# Patient Record
Sex: Female | Born: 1975 | Race: White | Hispanic: No | Marital: Married | Smoking: Never smoker
Health system: Southern US, Community
[De-identification: ages and names within clinical notes are randomized; demographics above are authoritative.]

## PROBLEM LIST (undated history)

## (undated) DIAGNOSIS — E669 Obesity, unspecified: Secondary | ICD-10-CM

## (undated) DIAGNOSIS — F4010 Social phobia, unspecified: Secondary | ICD-10-CM

## (undated) DIAGNOSIS — E890 Postprocedural hypothyroidism: Secondary | ICD-10-CM

## (undated) DIAGNOSIS — J301 Allergic rhinitis due to pollen: Secondary | ICD-10-CM

## (undated) DIAGNOSIS — Z8679 Personal history of other diseases of the circulatory system: Secondary | ICD-10-CM

## (undated) DIAGNOSIS — F410 Panic disorder [episodic paroxysmal anxiety] without agoraphobia: Secondary | ICD-10-CM

## (undated) DIAGNOSIS — E282 Polycystic ovarian syndrome: Secondary | ICD-10-CM

## (undated) DIAGNOSIS — I351 Nonrheumatic aortic (valve) insufficiency: Secondary | ICD-10-CM

## (undated) HISTORY — DX: Panic disorder (episodic paroxysmal anxiety): F41.0

## (undated) HISTORY — DX: Postprocedural hypothyroidism: E89.0

## (undated) HISTORY — DX: Social phobia, unspecified: F40.10

## (undated) HISTORY — DX: Polycystic ovarian syndrome: E28.2

## (undated) HISTORY — PX: LEEP: SHX91

## (undated) HISTORY — DX: Allergic rhinitis due to pollen: J30.1

## (undated) HISTORY — DX: Nonrheumatic aortic (valve) insufficiency: I35.1

## (undated) HISTORY — DX: Personal history of other diseases of the circulatory system: Z86.79

---

## 1997-02-02 HISTORY — PX: BREAST ENHANCEMENT SURGERY: SHX7

## 1999-02-03 HISTORY — PX: FOOT SURGERY: SHX648

## 1999-05-06 ENCOUNTER — Other Ambulatory Visit: Admission: RE | Admit: 1999-05-06 | Discharge: 1999-05-06 | Payer: Self-pay | Admitting: Obstetrics and Gynecology

## 2002-09-08 ENCOUNTER — Other Ambulatory Visit: Admission: RE | Admit: 2002-09-08 | Discharge: 2002-09-08 | Payer: Self-pay | Admitting: Obstetrics and Gynecology

## 2003-05-11 ENCOUNTER — Ambulatory Visit (HOSPITAL_COMMUNITY): Admission: RE | Admit: 2003-05-11 | Discharge: 2003-05-11 | Payer: Self-pay | Admitting: Obstetrics and Gynecology

## 2003-05-11 ENCOUNTER — Encounter (INDEPENDENT_AMBULATORY_CARE_PROVIDER_SITE_OTHER): Payer: Self-pay | Admitting: Specialist

## 2003-05-24 ENCOUNTER — Other Ambulatory Visit: Admission: RE | Admit: 2003-05-24 | Discharge: 2003-05-24 | Payer: Self-pay | Admitting: Obstetrics and Gynecology

## 2003-10-19 ENCOUNTER — Other Ambulatory Visit: Admission: RE | Admit: 2003-10-19 | Discharge: 2003-10-19 | Payer: Self-pay | Admitting: Obstetrics and Gynecology

## 2004-05-07 ENCOUNTER — Other Ambulatory Visit: Admission: RE | Admit: 2004-05-07 | Discharge: 2004-05-07 | Payer: Self-pay | Admitting: Obstetrics and Gynecology

## 2005-02-26 ENCOUNTER — Other Ambulatory Visit: Admission: RE | Admit: 2005-02-26 | Discharge: 2005-02-26 | Payer: Self-pay | Admitting: Obstetrics and Gynecology

## 2006-08-31 ENCOUNTER — Encounter: Admission: RE | Admit: 2006-08-31 | Discharge: 2006-11-29 | Payer: Self-pay | Admitting: Obstetrics and Gynecology

## 2006-12-09 ENCOUNTER — Ambulatory Visit (HOSPITAL_COMMUNITY): Admission: RE | Admit: 2006-12-09 | Discharge: 2006-12-09 | Payer: Self-pay | Admitting: *Deleted

## 2006-12-18 ENCOUNTER — Inpatient Hospital Stay (HOSPITAL_COMMUNITY): Admission: AD | Admit: 2006-12-18 | Discharge: 2006-12-18 | Payer: Self-pay | Admitting: Obstetrics and Gynecology

## 2007-01-05 ENCOUNTER — Ambulatory Visit (HOSPITAL_COMMUNITY): Admission: RE | Admit: 2007-01-05 | Discharge: 2007-01-05 | Payer: Self-pay | Admitting: Obstetrics and Gynecology

## 2007-02-03 HISTORY — PX: THYROIDECTOMY, PARTIAL: SHX18

## 2007-02-17 ENCOUNTER — Inpatient Hospital Stay (HOSPITAL_COMMUNITY): Admission: AD | Admit: 2007-02-17 | Discharge: 2007-02-17 | Payer: Self-pay | Admitting: Obstetrics and Gynecology

## 2007-03-01 ENCOUNTER — Inpatient Hospital Stay (HOSPITAL_COMMUNITY): Admission: AD | Admit: 2007-03-01 | Discharge: 2007-03-04 | Payer: Self-pay | Admitting: Obstetrics and Gynecology

## 2007-11-03 LAB — CONVERTED CEMR LAB: Pap Smear: ABNORMAL

## 2007-11-23 ENCOUNTER — Ambulatory Visit: Payer: Self-pay | Admitting: Family Medicine

## 2007-11-23 DIAGNOSIS — L905 Scar conditions and fibrosis of skin: Secondary | ICD-10-CM | POA: Insufficient documentation

## 2007-11-23 DIAGNOSIS — J45909 Unspecified asthma, uncomplicated: Secondary | ICD-10-CM | POA: Insufficient documentation

## 2007-11-23 DIAGNOSIS — J069 Acute upper respiratory infection, unspecified: Secondary | ICD-10-CM | POA: Insufficient documentation

## 2007-11-23 DIAGNOSIS — E039 Hypothyroidism, unspecified: Secondary | ICD-10-CM | POA: Insufficient documentation

## 2007-11-25 LAB — CONVERTED CEMR LAB: TSH: 3.03 microintl units/mL (ref 0.35–5.50)

## 2008-02-22 IMAGING — US US SOFT TISSUE HEAD/NECK
1 series · 14 of 25 positions shown · non-contrast
Comparison: None available.

CLINICAL DATA: Large thyroid nodule. 
 THYROID ULTRASOUND:
TECHNIQUE: Ultrasound examination of the thyroid gland and adjacent soft tissue structures was performed.

[Series 1: unknown · 0.09mm/px · 14 of 34 slices shown]
[im 1/34]
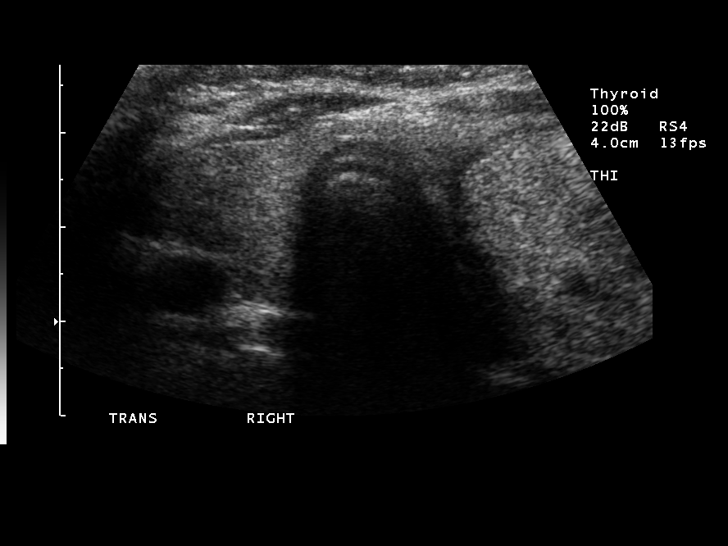
[im 3/34]
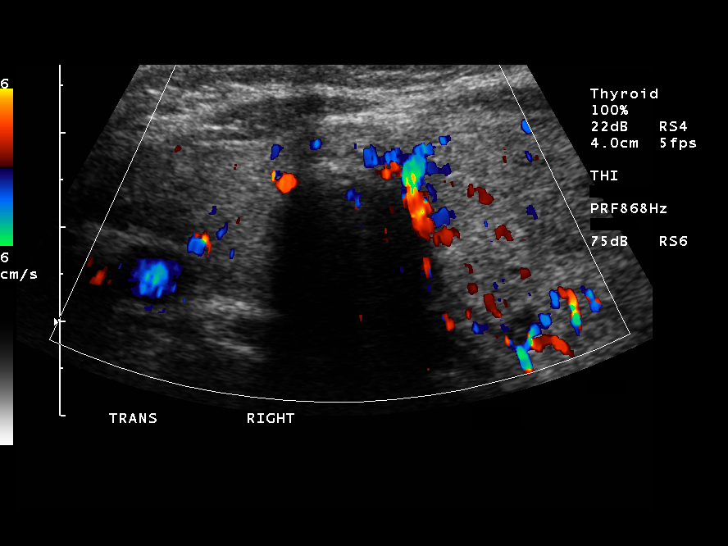
[im 6/34]
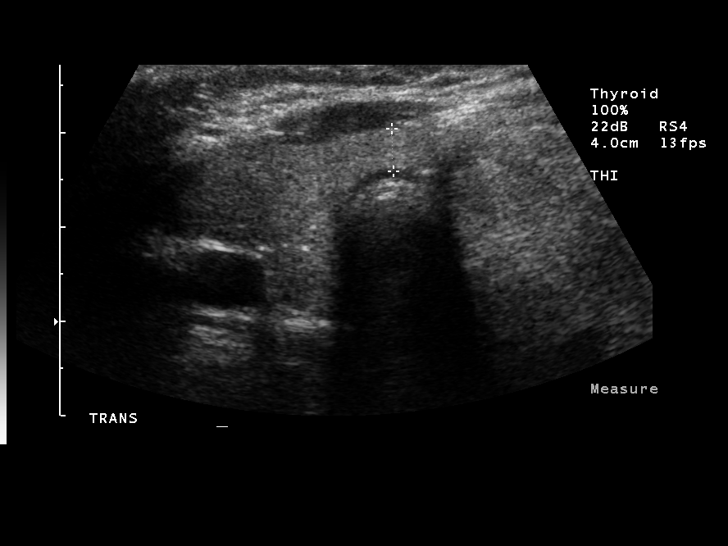
[im 9/34]
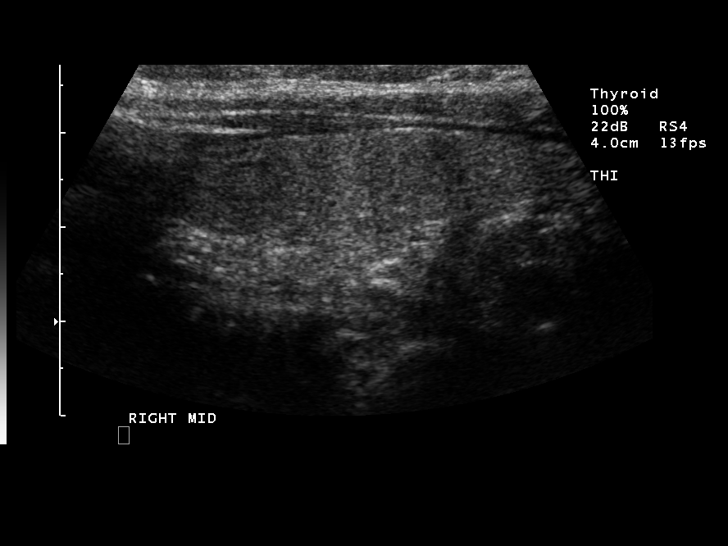
[im 12/34]
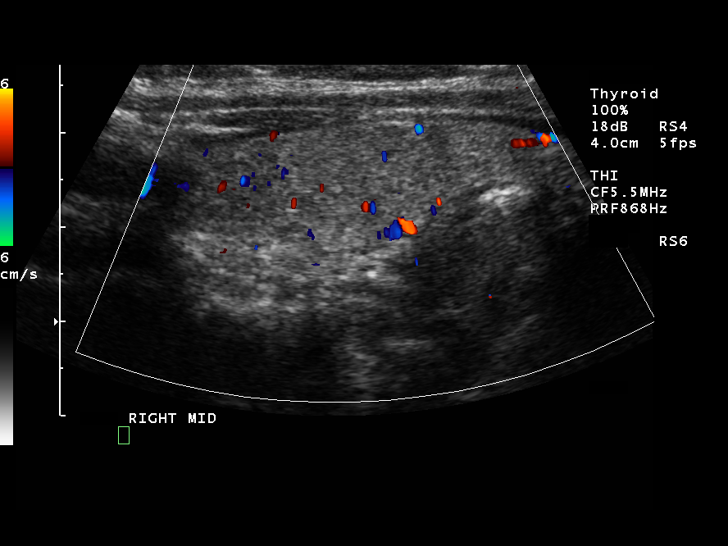
[im 13/34]
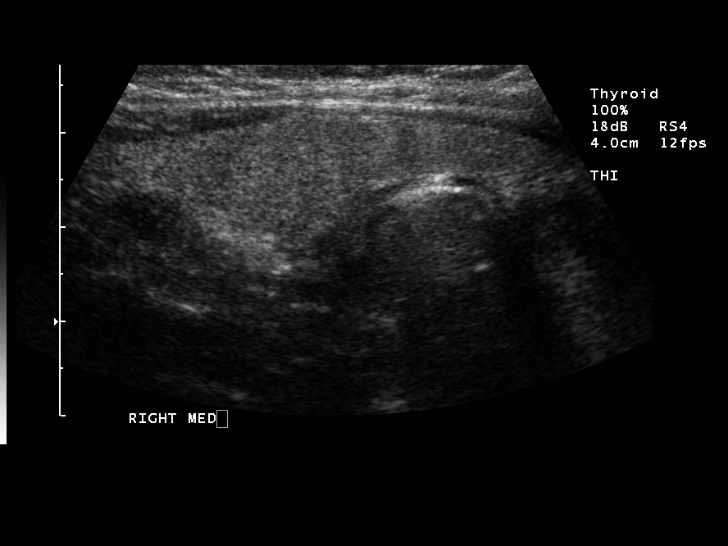
[im 16/34]
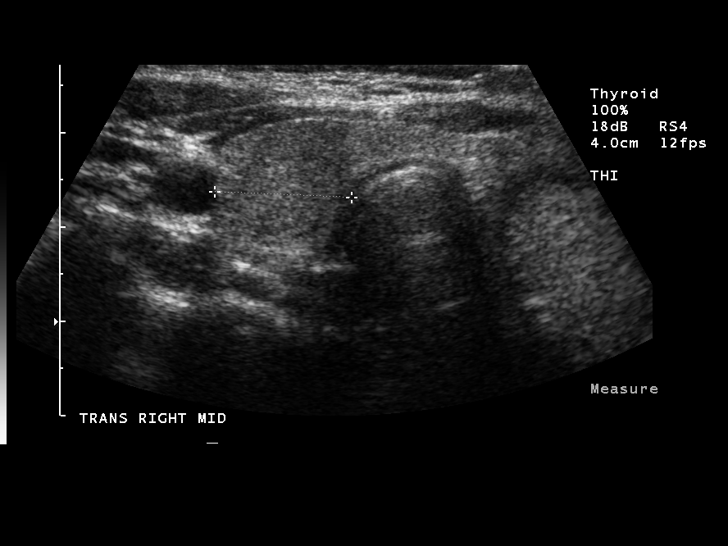
[im 18/34]
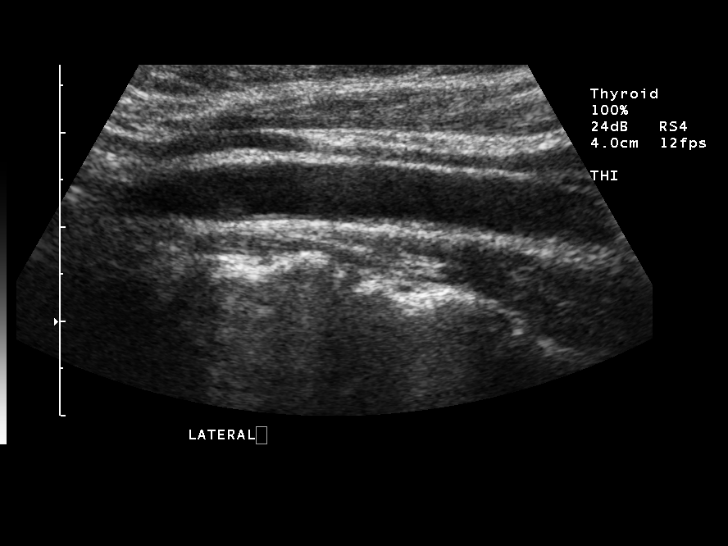
[im 21/34]
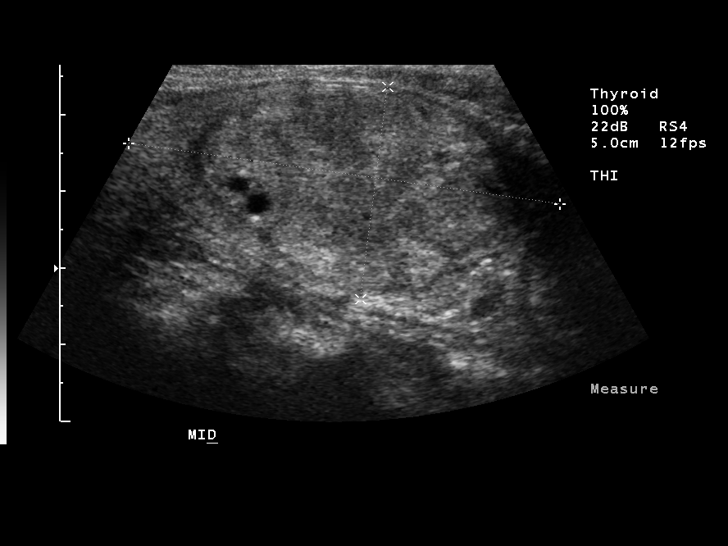
[im 23/34]
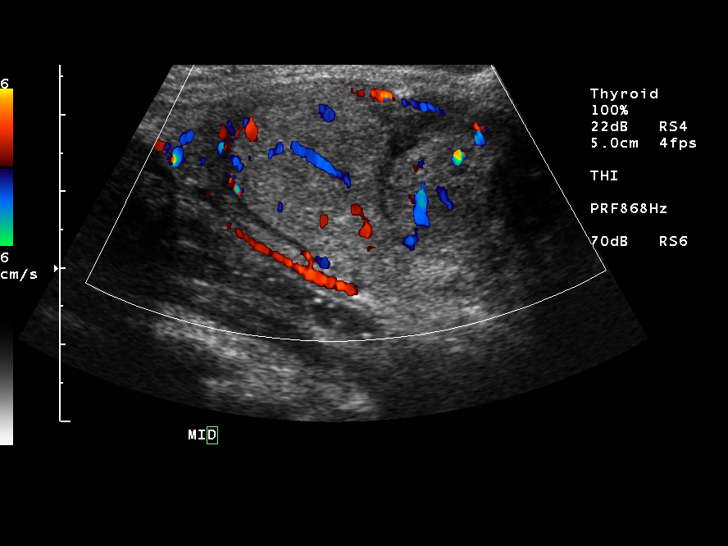
[im 25/34]
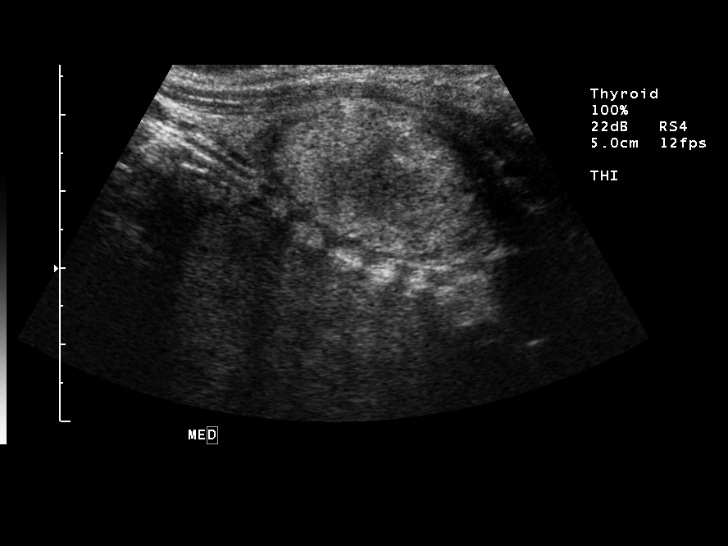
[im 28/34]
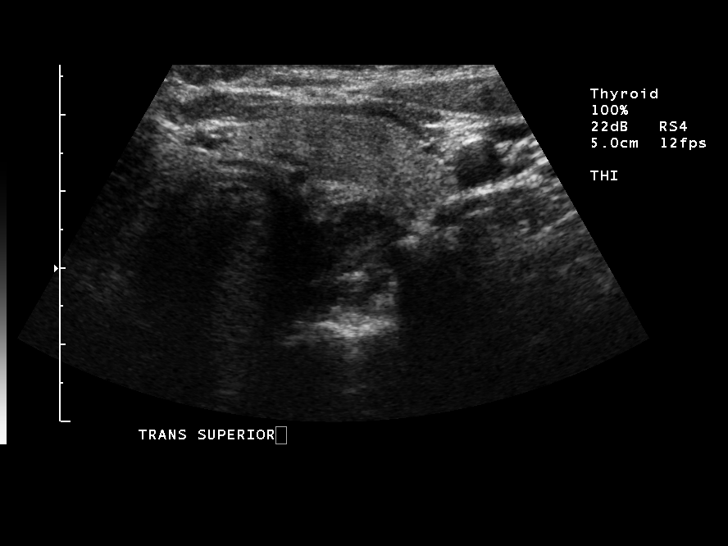
[im 31/34]
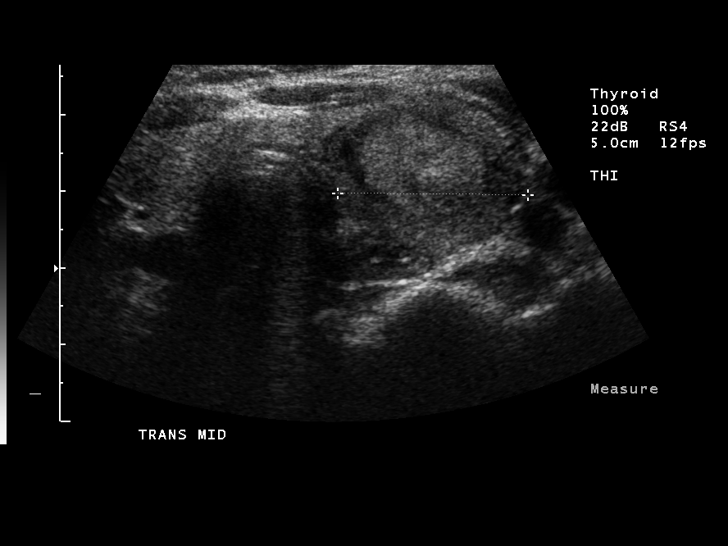
[im 34/34]
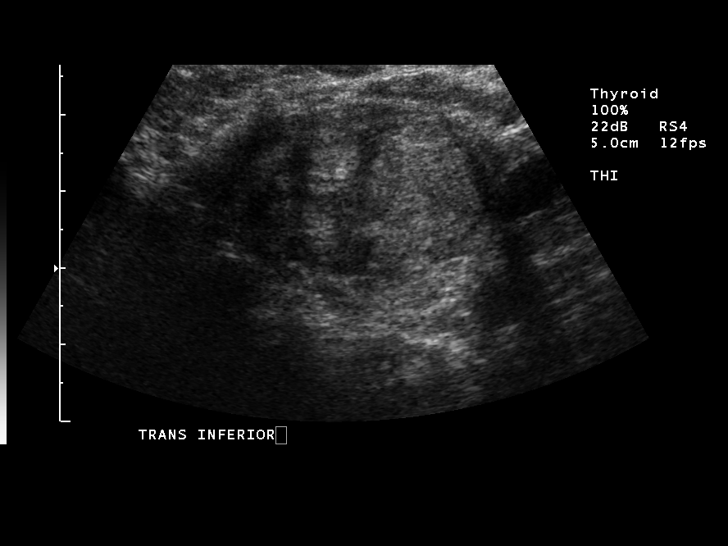

[14 of 25 positions shown; findings below may reference images not displayed]

FINDINGS: Right thyroid lobe measures 4.9 X 1.7 X 1.5 cm.  Left thyroid lobe measures 6.6 X 2.9 X 3.0 cm.  The isthmus is 4-5 mm.  
 No focal nodules on the right.  Within the left thyroid lobe, there is a large 5.1 X 4.3 X 2.6 cm solid heterogeneous mass.  Small cystic components noted as well.  Neoplasm cannot be excluded.
IMPRESSION: Single large nodule replacing much of the left thyroid lobe measuring up to 5.1 cm.  This is predominantly solid.  Recommend tissue sampling.

## 2009-03-13 ENCOUNTER — Telehealth: Payer: Self-pay | Admitting: Family Medicine

## 2009-09-12 ENCOUNTER — Ambulatory Visit: Payer: Self-pay | Admitting: Family Medicine

## 2009-09-12 DIAGNOSIS — R1011 Right upper quadrant pain: Secondary | ICD-10-CM | POA: Insufficient documentation

## 2009-09-16 ENCOUNTER — Encounter: Admission: RE | Admit: 2009-09-16 | Discharge: 2009-09-16 | Payer: Self-pay | Admitting: Family Medicine

## 2009-09-16 LAB — CONVERTED CEMR LAB
AST: 18 units/L (ref 0–37)
BUN: 19 mg/dL (ref 6–23)
CO2: 26 meq/L (ref 19–32)
Calcium: 9.5 mg/dL (ref 8.4–10.5)
Chloride: 106 meq/L (ref 96–112)
GFR calc non Af Amer: 123.75 mL/min (ref >60)
Lipase: 33 units/L (ref 11.0–59.0)
Potassium: 4.2 meq/L (ref 3.5–5.1)
Sodium: 142 meq/L (ref 135–145)
Total Protein: 7 g/dL (ref 6.0–8.3)

## 2009-10-25 ENCOUNTER — Ambulatory Visit: Payer: Self-pay | Admitting: Family Medicine

## 2009-10-25 DIAGNOSIS — M109 Gout, unspecified: Secondary | ICD-10-CM | POA: Insufficient documentation

## 2010-03-04 NOTE — Progress Notes (Signed)
Summary: pt going to urgent care  Phone Note Call from Patient Call back at Home Phone [phone]   Caller: Spouse Summary of Call: Pt's husband states pt has cough, chest hurts with coughing, sore throat- sxs x one week.  Advised husband that Dr. Ermalene Searing can work her in if she can come now.  Pt declined, said she would go to urgent care. Initial call taken by: Lowella Petties CMA,  March 13, 2009 10:33 AM

## 2010-03-04 NOTE — Assessment & Plan Note (Signed)
Summary: R FOOT PAIN/EVM   Vital Signs:  Patient Profile:   35 Years Old Female CC:      Right Foot Pain / rwt Height:     66.25 inches Weight:      215 pounds BMI:     34.56 O2 Sat:      99 % O2 treatment:    Room Air Temp:     97.2 degrees F oral Pulse rate:   102 / minute Pulse rhythm:   regular Resp:     18 per minute BP sitting:   146 / 95  (right arm)  Pt. in pain?   yes    Location:   foot    Intensity:   8    Type:       aching  Vitals Entered By: Levonne Spiller EMT-P (October 25, 2009 3:22 PM)              Is Patient Diabetic? No      Current Allergies: ! CODEINEHistory of Present Illness Chief Complaint: Right Foot Pain / rwt    Plan New Medications/Changes: COLCRYS 0.6 MG TABS (COLCHICINE) 2 by mouth then 1 by mouth 1 hour later for Gout. May take 1  Q8 hours as needed. Hold for loose stools/n/v  #30 x 0, 10/25/2009, Tacey Ruiz MD INDOMETHACIN 50 MG CAPS (INDOMETHACIN) 1 by mouth Q8 hours x 5 days max  #30 x 0, 10/25/2009, Tacey Ruiz MD   The patient and/or caregiver has been counseled thoroughly with regard to medications prescribed including dosage, schedule, interactions, rationale for use, and possible side effects and they verbalize understanding.  Diagnoses and expected course of recovery discussed and will return if not improved as expected or if the condition worsens. Patient and/or caregiver verbalized understanding.  Prescriptions: COLCRYS 0.6 MG TABS (COLCHICINE) 2 by mouth then 1 by mouth 1 hour later for Gout. May take 1  Q8 hours as needed. Hold for loose stools/n/v  #30 x 0   Entered and Authorized by:   Tacey Ruiz MD   Signed by:   Tacey Ruiz MD on 10/25/2009   Method used:   Electronically to        [facility] [phone][facility] (retail)       [address] [place], [place]  [address]       Ph: [phone] or [phone]       Fax: [phone]   RxID:   [id number] INDOMETHACIN 50 MG CAPS (INDOMETHACIN) 1 by  mouth Q8 hours x 5 days max  #30 x 0   Entered and Authorized by:   Tacey Ruiz MD   Signed by:   Tacey Ruiz MD on 10/25/2009   Method used:   Electronically to        [facility] [phone]* (retail)       [address] [place], [place]  [address]       Ph: [phone] or [phone]       Fax: [phone]   RxID:   [id number]   Patient Instructions: 1)  To prevent gout attacks,avoid purine rich foods, such as beer, beans & peas, and meat gravies.  Appended Document: R FOOT PAIN/EVM      Current Allergies: ! CODEINEHistory of Present Illness History from: patient Reason for visit: see chief complaint  Chief Complaint: R great toe pain History of Present Illness: Pain in right great toe began on Tues. Denies injury. She did wear uncomfortable shoes on Monday, but has never had this reaction previously.   She reports that she has been eating diet higher in proteins - in an attempt to loose weight. She has been quite successful and now has a regular menses as well.  She does not drink a lot of water.   Her father has gout.   REVIEW OF SYSTEMS Constitutional Symptoms      Denies fever and chills.       Musculoskeletal       Complains of joint pain, joint stiffness, decreased range of motion, redness, and swelling.   PMH-FH-SH reviewed for relevance  Family History: Reviewed history from 11/23/2007 and no changes required. Father:  Mother:  Siblings:   Parent: OA, BRCA, Mental Illness Grandparent: OA, Chol, CVA, HTN, Mental Illness GYN CA, CAD - other  Family History of Arthritis Family History Breast cancer 1st degree relative <50 Family History High cholesterol  Social History: Reviewed history from 11/23/2007 and no changes required. 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  Diagnoses and expected course of recovery discussed and will return if not improved as expected or if the condition worsens. Patient and/or caregiver verbalized understanding.   Medication Administration  Injection # 1:    Medication: Solumedrol up to 125mg     Diagnosis: Gout    Route: IM    Site: RUOQ gluteus    Exp Date: 03/04/2012    Lot #: Velva Harman    Mfr: Pfizer    Patient tolerated injection without complications    Given by: Levonne Spiller EMT-P (October 25, 2009 4:45 PM)  Orders Added: 1)  Est. Patient Level III [[address]] 2)  Solumedrol up to 125mg  [J2930] 3)  Admin of Therapeutic Inj (IM or [place]) [[address]]  The risks, benefits and possible side effects of the treatments and tests were explained clearly to the patient and the patient verbalized understanding.  The patient was informed that there is no on-call provider or services available at this clinic during off-hours (when the clinic is closed).  If the patient developed a problem or concern that required immediate attention, the patient was advised to go the the nearest available urgent care or emergency department for medical care.  The patient verbalized understanding.

## 2010-03-04 NOTE — Assessment & Plan Note (Signed)
Summary: ?GALLBLADDER/CLE   Vital Signs:  Patient profile:   35 year old female Height:      68 inches Weight:      204.4 pounds BMI:     31.19 Temp:     98.5 degrees F oral Pulse rate:   64 / minute Pulse rhythm:   regular BP sitting:   110 / 80  (left arm) Cuff size:   large  Vitals Entered By: Benny Lennert CMA Duncan Dull) (September 12, 2009 2:48 PM)  History of Present Illness: Chief complaint ? gallbladder  35 year old female:  Right rib, under.  Feels puff and swollen, and every tie she eats and will sick.  Also will get some shoulder  blade pain.  Gallbladder in multiple family member.  Sometimes will get some diarrhea   pleasant 35 year old lady, who is generally very healthy, and she presents with some right upper quadrant pain has been ongoing intermittently for the last 4 months or so. Primarily she has discomfort after she eats. She essentially has this after every meal. It is not an acute horrible pain, but is certainly very interfering.  Multiple families with gallbladder disease. In any history of liver or kidney problems. No known history of ulcer. Additionally, she had some recent shoulder blade pain in the right upper shoulder.  Review of systems: No fever, chills. There is nausea. She also has some occasional diarrhea. No bloody stools or black tarry stools. She is able to even drink, this is diminished due to the symptoms.GEN: WDWN, NAD, Non-toxic, A & O x 3 HEENT: Atraumatic, Normocephalic. Neck supple. No masses, No LAD. Ears and Nose: No external deformity. CV: RRR, No M/G/R. No JVD. No thrill. No extra heart sounds. PULM: CTA B, no wheezes, crackles, rhonchi. No retractions. No resp. distress. No accessory muscle use. ABD: S, NT, ND, +BS. No rebound tenderness. No HSM.  EXTR: No c/c/e NEURO: Normal gait.  PSYCH: Normally interactive. Conversant. Not depressed or anxious appearing.  Calm demeanor.    Allergies: 1)  ! Codeine  Past History:  Past  medical, surgical, family and social histories (including risk factors) reviewed, and no changes noted (except as noted below).  Past Medical History: Reviewed history from 11/23/2007 and no changes required. Asthma Hypothyroidism Migraines Allergic Rhinitis Heart Murmur h/o HBP w/o dx. HTN  Past Surgical History: Reviewed history from 11/23/2007 and no changes required. 2009, Partial Thyroidectomy, [facility] 2009, c-section [facility]) Foot Surgery, 2001 Breast Augmentation, 1999  Colpo, Cryo, Leeps  Family History: Reviewed history from 11/23/2007 and no changes required. Father:  Mother:  Siblings:   Parent: OA, BRCA, Mental Illness Grandparent: OA, Chol, CVA, HTN, Mental Illness GYN CA, CAD - other  Family History of Arthritis Family History Breast cancer 1st degree relative <50 Family History High cholesterol  Social History: Reviewed history from 11/23/2007 and no changes required. Marital Status: Married Children: 1 Occupation: Biomedical scientist Never Smoked Alcohol use-no Drug use-no Regular exercise-no   Impression & Recommendations:  Problem # 1:  ABDOMINAL PAIN, RIGHT UPPER QUADRANT (ICD-789.01) Assessment New 25 minutes spent face-to-face, greater than 50% in counseling. Upper quadrant plan, right: would undergo basic workup for abdominal pain, and obtain right upper quadrant ultrasound to evaluate for gallbladder pathology.  If the gallbladder does indeed have stones, the patient would not like to pursue further operative interventions at this point.  Orders: Venipuncture (27[address]) TLB-BMP (Basic Metabolic Panel-BMET) (80048-METABOL) TLB-Hepatic/Liver Function Pnl (80076-HEPATIC) TLB-Lipase (83690-LIPASE) TLB-H. Pylori Abs(Helicobacter Pylori) (86677-HELICO) Radiology Referral (  Radiology)  Complete Medication List: 1)  Tri-lo-sprintec 0.025 Mg Tabs (Norgestimate-ethinyl estradiol) .... Take one tablet daily  Patient Instructions: 1)  Referral  Appointment Information 2)  Day/Date: 3)  Time: 4)  Place/MD: 5)  Address: 6)  Phone/Fax: 7)  Patient given appointment information. Information/Orders faxed/mailed.   Current Allergies (reviewed today): ! CODEINE

## 2010-06-17 NOTE — Op Note (Signed)
Sharon Kerr, Sharon Kerr            ACCOUNT NO.:  [id number]   MEDICAL RECORD NO.:  [id number]          PATIENT TYPE:  INP   LOCATION:  9146                          [facility]ACILITY:  [facility]   PHYSICIAN:  Dineen Kid. Rana Snare, M.D.    DATE OF BIRTH:  [birth year]   DATE OF PROCEDURE:  03/01/2007  DATE OF DISCHARGE:                               OPERATIVE REPORT   PREOPERATIVE DIAGNOSIS:  Intrauterine pregnancy at 38 weeks, pregnancy-  induced hypertension and arrest of dilation.   POSTOPERATIVE DIAGNOSIS:  Intrauterine pregnancy at 38 weeks, pregnancy-  induced hypertension and arrest of dilation.   PROCEDURES:  Primary low segment transverse cesarean section.   SURGEON:  Dr. Candice Camp.   ANESTHESIA:  Epidural.   INDICATIONS:  [name] is a 35 year old G1 at 39-1/2 weeks'  gestational age who was seen in the office with elevated blood pressures  which had been persistent for the last 1-2 weeks.  She has occasional  headaches, no scotomata.  She was over the hospital for induction of  labor.  She was originally 2 cm and 80% with a +1 station after  amniotomy and augmentation of labor with Pitocin.  She progressed to 3-4  cm complete and +1 station.  Intrauterine catheter was placed and after  having adequate labor as evidenced by a greater than 200 mon[place]video  units for 5 hours no further progression beyond 3-4 cm complete +1  station, we proceeded with primary low segment transverse cesarean  section for arrest of dilation.  The risks, benefits were discussed.  Informed consent was obtained.   FINDINGS AT TIME OF SURGERY:  Viable female infant, Apgars were 09/09, pH  arterial 7.31, weight was 5 pounds 13 ounces.   DESCRIPTION OF PROCEDURE:  After adequate analgesia, the patient placed  in the supine position, left lateral tilt.  She is sterilely prepped and  draped.  Bladder sterilely drained.  Pfannenstiel skin incision made two  fingerbreadths above pubic symphysis, taken down sharply to  fascia which  was incised transversely, extended superiorly and inferiorly off the  bellies rectus muscle which was separated sharply in midline.  The  peritoneum was entered sharply.  Bladder flap created and placed behind  bladder blade.  Low segment myotomy incision made down to the infant's  vertex and lateral to the operator's fingertips.  Infant was delivered  atraumatically.  The nose and pharynx were suctioned.  Infant was  delivered, cord clamped and cut, handed to pediatrician for  resuscitation.  Cord blood was obtained.  Placenta extracted manually.  Uterus was exteriorized, wiped clean with a dry lap.  The myotomy  incision was closed in two layers, first being a running locking layer,  second being imbricating layer of 0-0 Monocryl suture.  The uterus  placed back the peritoneal cavity and after copious irrigation adequate  hemostasis was assured.  The peritoneum closed with 0-0 Monocryl.  Rectus muscle plicated in midline.  Irrigation applied and after  adequate hemostasis and the fascia was closed #1 PDS in a running  fashion.  Irrigation was applied and after  adequate hemostasis, skin staples, Steri-Strips applied.  The patient  tolerated procedure well, stable on transfer to recovery room, sponge  and instrument count was normal x3.  Estimated blood loss 600 mL.  The  patient received 1 gram of cefotetan after delivery of placenta.      Dineen Kid Rana Snare, M.D.  Electronically Signed     DCL/MEDQ  D:  03/02/2007  T:  03/02/2007  Job:  [id number]

## 2010-06-20 NOTE — Op Note (Signed)
NAME:  Sharon Kerr, Sharon Kerr                        ACCOUNT NO.:  [id number]   MEDICAL RECORD NO.:  [id number]                   PATIENT TYPE:  AMB   LOCATION:  SDC                                  [facility]   PHYSICIAN:  Dineen Kid. Rana Snare, M.D.                 DATE OF BIRTH:  [birth year]   DATE OF PROCEDURE:  05/11/2003  DATE OF DISCHARGE:                                 OPERATIVE REPORT   PREOPERATIVE DIAGNOSES:  1. Abnormal uterine bleeding.  2. Endometrial mass.   POSTOPERATIVE DIAGNOSES:  1. Abnormal uterine bleeding.  2. Endometrial mass.  3. Endometrial polyp.   PROCEDURES:  Hysteroscopy, dilatation and curettage.   SURGEON:  Dineen Kid. Rana Snare, M.D.   ANESTHESIA:  Monitored anesthetic care and paracervical block.   INDICATIONS:  [name] is a 35 year old with persistent heavy and  irregular bleeding despite birth control pills, who underwent a  sonohysterogram showing an endometrial mass consistent with a polyp.  She  presents today for hysteroscopy, D&C for removal of this polyp.  Risks and  benefits were discussed, informed consent was obtained.   Findings at the time of surgery:  A posterior fundal endometrial polyp, a  normal-appearing cervix, ostia, and endometrium after the procedure.   DESCRIPTION OF PROCEDURE:  After adequate analgesia, the patient placed in  the dorsal lithotomy position.  She was sterilely prepped and draped, the  bladder was sterilely drained, the Graves speculum was placed.  A tenaculum  was placed on the anterior lip of the cervix.  A paracervical block was  placed with 1:100,000 epinephrine and 1% Xylocaine, 20 mL were used.  The  uterus was sounded to 8 cm, easily dilated to a #27 Pratt dilator.  The  hysteroscope was inserted and the above findings were noted.  Polyp forceps  were used to grasp and retrieve the endometrial polyp.  This was followed by  sharp curettage, retrieving the small fragments of the base of the polyp.  The  hysteroscope was then inserted, revealing a normal-appearing endometrium  and normal-appearing cervix.  The hysteroscope was then removed, the  tenaculum removed from the anterior lip of the cervix.  It was noted to be  hemostatic.  The speculum was then removed and the patient was transferred  to the recovery room in stable condition.  The sponge and instrument count  was normal x3.  The patient received 30 mg of Toradol IV.   DISPOSITION:  The patient is to be discharged home, to follow up in the  office in one or two weeks.  She was sent home with a routine instruction  sheet for a D&C, told to return for increased cramping, bleeding, or fever.  Dineen Kid Rana Snare, M.D.   DCL/MEDQ  D:  05/11/2003  T:  05/11/2003  Job:  [id number]

## 2010-06-20 NOTE — H&P (Signed)
NAME:  Sharon Kerr, Sharon Kerr                        ACCOUNT NO.:  [id number]   MEDICAL RECORD NO.:  [id number]                   PATIENT TYPE:  AMB   LOCATION:  SDC                                  [facility]   PHYSICIAN:  Dineen Kid. Rana Snare, M.D.                 DATE OF BIRTH:  Aug 22, 1975   DATE OF ADMISSION:  DATE OF DISCHARGE:                                HISTORY & PHYSICAL   HISTORY OF PRESENT ILLNESS:  Ms. Artley is a 35 year old, nulligravida with  abnormal uterine bleeding and heavy bleeding despite being on birth control  pills going on for the last 4-5 years, underwent a sonohysterogram which  shows 6.8 cm uterus in a polypoid mass in the posterior endometrium  measuring 19 mm. She presents for definitive surgical evaluation and  treatment of this.   PAST MEDICAL HISTORY:  History of headaches.   PAST SURGICAL HISTORY:  Breast implants and foot surgery.   MEDICATIONS:  She has no known drug allergies.   PHYSICAL EXAMINATION:  VITAL SIGNS:  Blood pressure is 124/88.  HEART:  Regular rate and rhythm.  LUNGS:  Clear to auscultation bilaterally.  ABDOMEN:  Nondistended, nontender.  PELVIC:  The uterus is anteverted, mobile and nontender.   IMPRESSION:  Abnormal uterine bleeding, sonohysterogram consistent with  endometrial mass.   PLAN:  Hysteroscopy D&C for evaluation and removal of this.  The risks and  benefits were discussed at length which include but not limited to risk of  infection, bleeding, damage to uterus, tubes, ovaries, bowel, bladder, risk  associated with anesthesia, possibility this may not alleviate the bleeding  or worsen the bleeding or recur. She does give her informed consent.                                               Dineen Kid Rana Snare, M.D.    DCL/MEDQ  D:  05/10/2003  T:  05/10/2003  Job:  [id number]

## 2010-06-20 NOTE — Discharge Summary (Signed)
NAMECAPTOLA, TESCHNER            ACCOUNT NO.:  [id number]   MEDICAL RECORD NO.:  [id number]          PATIENT TYPE:  INP   LOCATION:  9146                          [facility]   PHYSICIAN:  Guy Sandifer. Henderson Cloud, M.D. DATE OF BIRTH:  [birth year]   DATE OF ADMISSION:  03/01/2007  DATE OF DISCHARGE:  03/04/2007                               DISCHARGE SUMMARY   ADMISSION DIAGNOSES:  1. Intrauterine pregnancy at 39-1/2 weeks estimated gestational age.  2. Induction of labor secondary to pregnancy-induced hypertension.   DISCHARGE DIAGNOSES:  1. Status post low transverse cesarean section secondary to arrest of      dilatation.  2. Viable female infant.   PROCEDURE:  Primary low transverse cesarean section.   REASON FOR ADMISSION:  Please see written H&P.   HOSPITAL COURSE:  The patient is a 35 year old primigravida who was  admitted to [facility] at 39-1/2 weeks estimated  gestational age for induction of labor.  The patient had been seen in  the office,  where she was noted to have elevation in blood pressure  with an occasional headache, no scotoma.  The patient was now admitted  for induction of labor secondary to [facility].  On admission, vital signs were  stable.  Fetal heart tones were in the 140s with acceleration.  Contractions were noted to be somewhat irregular.  Cervix was dilated to  2 cm, 80% effaced, vertex at a -1 station.  Artificial rupture of  membranes was performed, which revealed clear fluid.  The patient had  been started on some Pitocin to augment her labor; however, she made no  further progress beyond 3 to 4 cm dilated.  The patient was now  transferred to the operating room, where epidural was dosed to an  adequate surgical level.  A low transverse incision was made with  delivery of a viable female infant weighing 5 pounds 13 ounces, Apgars of  9 at 1 minute and 9 at 5 minutes.  Arterial cord pH was 7.31.  The  patient tolerated the procedure well and  was taken to the recovery room  in stable condition.  On postoperative day #1, the patient was without  complaint.  She denied headache, blurred vision or right upper quadrant  pain.  Vital signs were stable.  Blood pressure was 120/80.  Abdomen was  soft with good return of bowel function.  Fundus was firm and nontender.  Abdominal dressing was noted to have a small amount of drainage noted on  the bandage.  Foley was  noted to have adequate drainage.  Laboratory  findings revealed hemoglobin of 12.5, platelet count 195,000, WBC count  of 13.2.  Blood type was known to be A negative.  On postoperative day  #2, the patient was without complaint.  Vital signs were stable.  Abdomen was soft, slightly distended with good return of bowel function.  Fundus was firm and nontender.  Abdominal dressing was noted to have a  small amount of drainage noted on the bandage.  Foley had been  discontinued; however, she had not voided at the time of rounding.  On  postoperative day #3, the patient was without complaint.  Vital signs  were stable.  She was afebrile.  Fundus firm and nontender.  Incision  was clean, dry and intact.  Staples were removed, and the patient was  later discharged home.   CONDITION ON DISCHARGE:  Good.   DIET:  Regular as tolerated.   ACTIVITY:  No heavy lifting, no driving x2 weeks.  No vaginal entry.   FOLLOW UP:  The patient to follow up in the office in 3 to 4 days for  blood pressure check and an incision check.  She is to call for  headache, blurred vision or right upper quadrant pain.  She is also to  call for heavy vaginal bleeding, redness or drainage from the incisional  site, temperature greater than 100.5.   DISCHARGE MEDICATIONS:  Tylox #30, 1 p.o. every 4 to 6 hours p.r.n.,  Motrin 600 mg every 6 hours, prenatal vitamins 1 p.o. daily, Colace  daily p.r.n.      Julio Sicks, N.P.      Guy Sandifer. Henderson Cloud, M.D.  Electronically Signed    CC/MEDQ  D:   03/18/2007  T:  03/19/2007  Job:  [id number]

## 2010-10-23 LAB — CBC
HCT: 35.6 — ABNORMAL LOW
HCT: 39.8
Hemoglobin: 13.8
MCHC: 34.6
MCHC: 35
MCV: 88.7
MCV: 88.9
Platelets: 257
RBC: 4.01
RBC: 4.47
RDW: 13.1
RDW: 13.6
WBC: 13.2 — ABNORMAL HIGH
WBC: 9.6

## 2010-10-23 LAB — RH IMMUNE GLOB WKUP(>/=20WKS)(NOT WOMEN'S HOSP): Fetal Screen: NEGATIVE

## 2010-10-23 LAB — COMPREHENSIVE METABOLIC PANEL
ALT: 14
Albumin: 2.8 — ABNORMAL LOW
Alkaline Phosphatase: 133 — ABNORMAL HIGH
BUN: 8
Calcium: 9.6
Chloride: 105
Creatinine, Ser: 0.59
Total Protein: 6.4

## 2010-10-23 LAB — LACTATE DEHYDROGENASE: LDH: 128

## 2010-11-03 IMAGING — US US ABDOMEN COMPLETE
1 series · 13 of 25 positions shown · non-contrast
Comparison: None

CLINICAL DATA: Right upper quadrant abdominal pain.

ABDOMEN ULTRASOUND
TECHNIQUE: Complete abdominal ultrasound examination was performed
including evaluation of the liver, gallbladder, bile ducts,
pancreas, kidneys, spleen, IVC, and abdominal aorta.

[Series 1: us abdomen complete · 0.24mm/px · 13 of 80 slices shown]
[im 1/80]
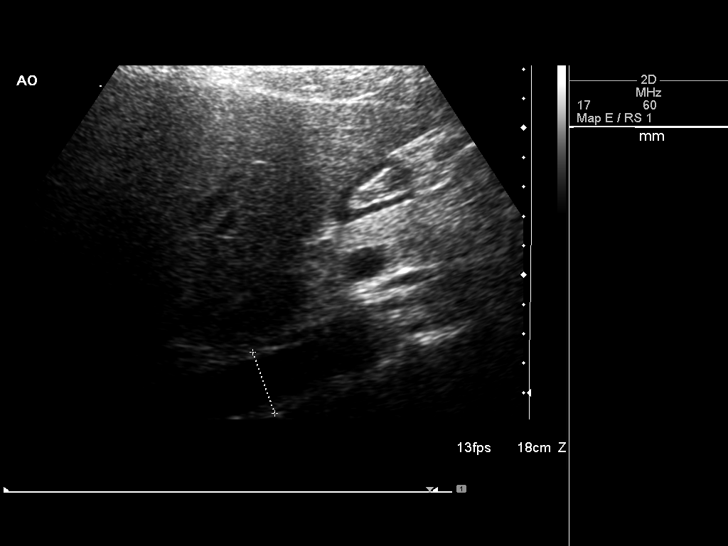
[im 7/80]
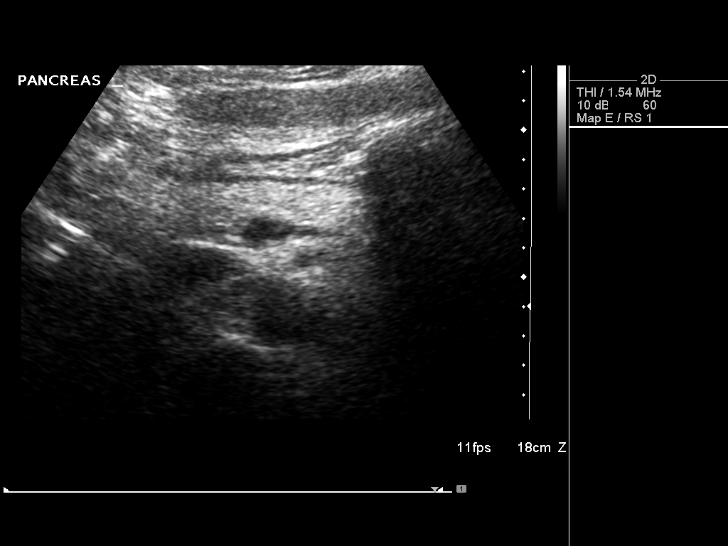
[im 14/80]
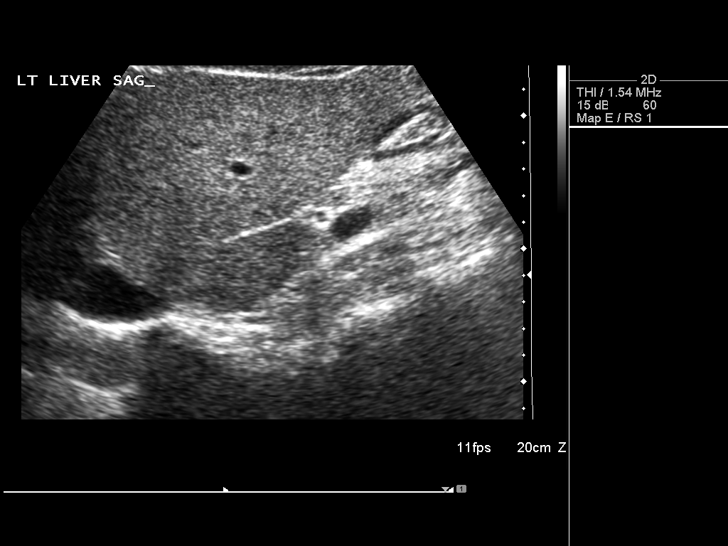
[im 20/80]
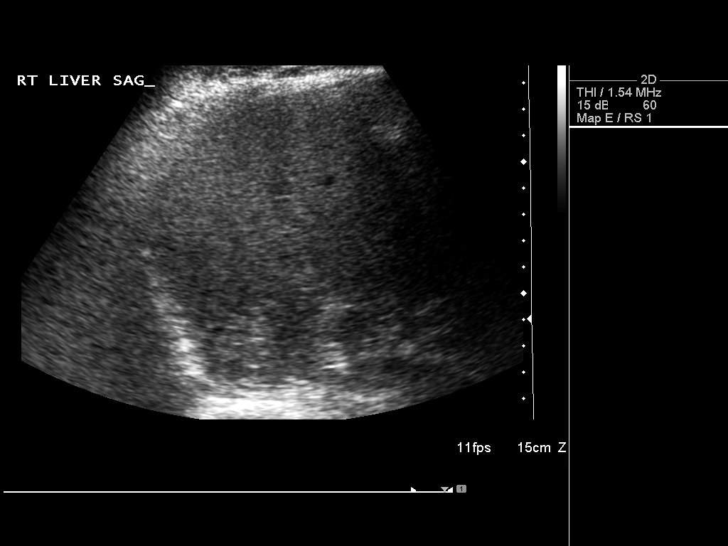
[im 27/80]
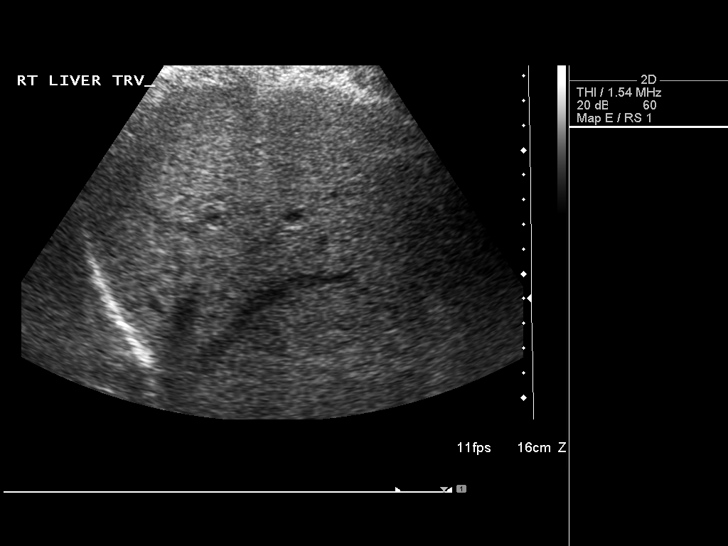
[im 33/80]
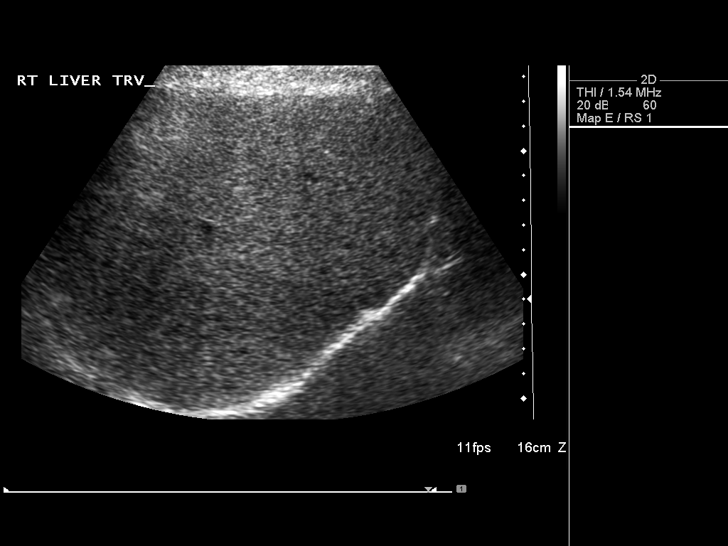
[im 40/80]
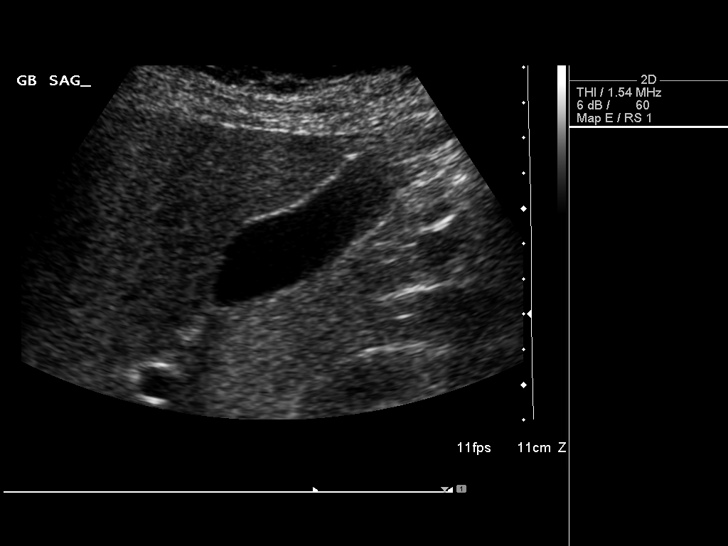
[im 47/80]
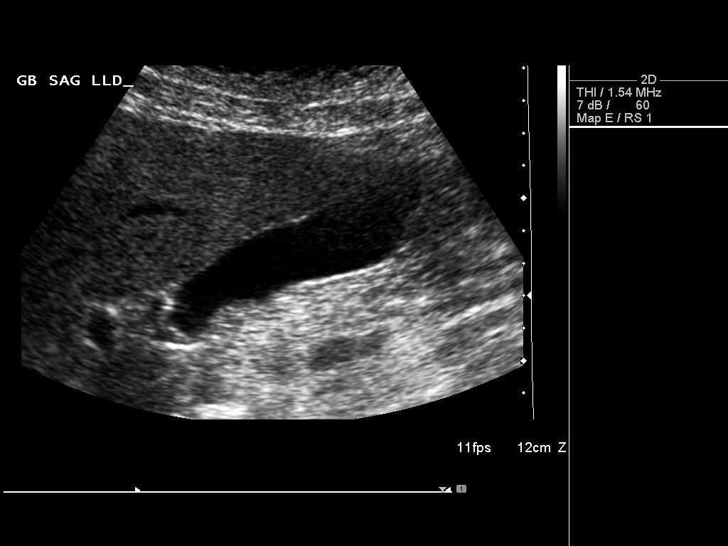
[im 53/80]
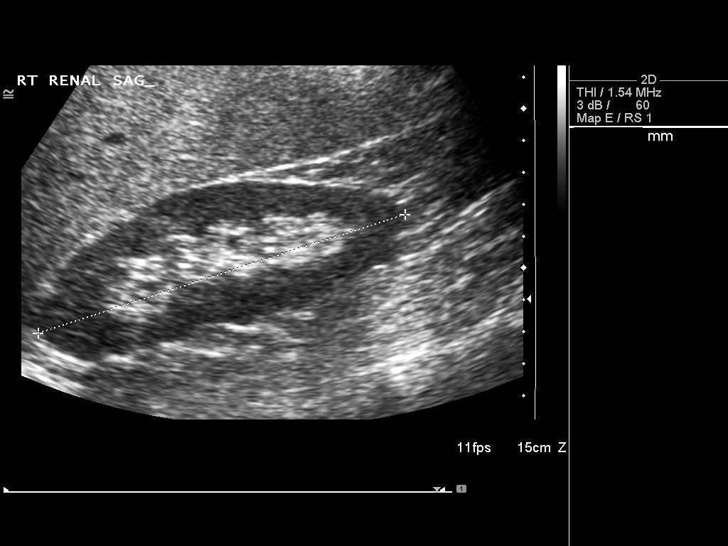
[im 60/80]
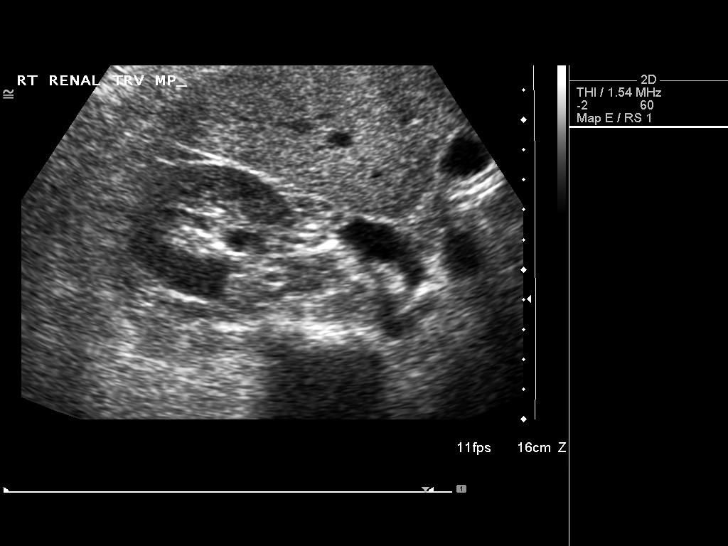
[im 66/80]
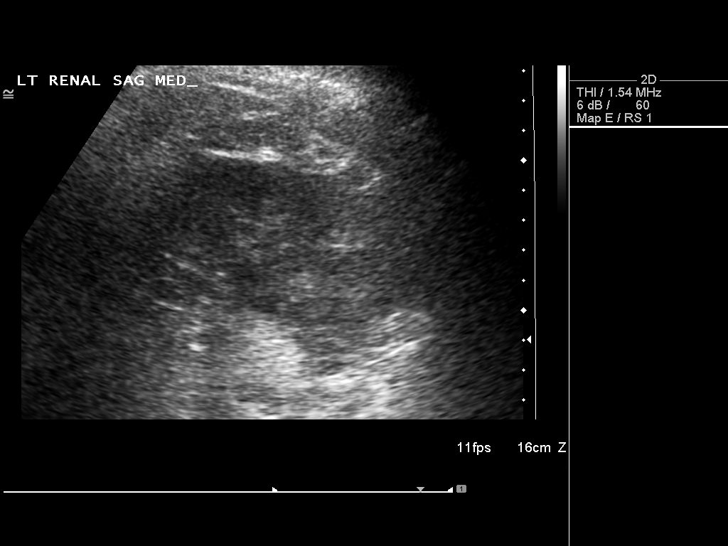
[im 73/80]
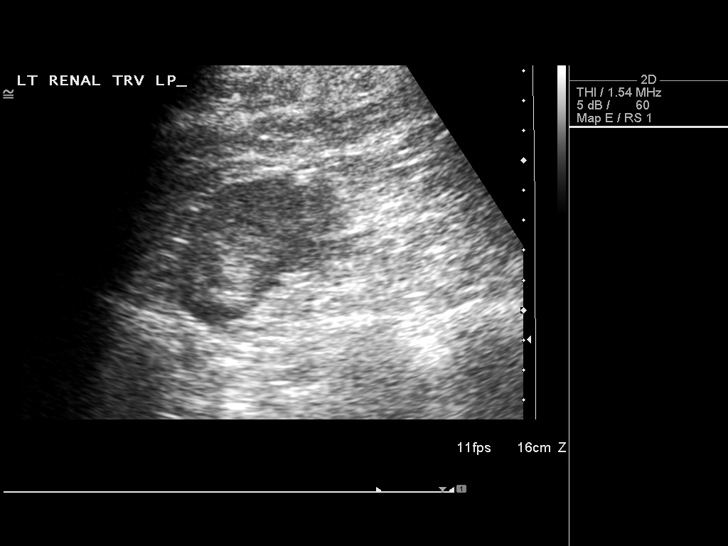
[im 80/80]
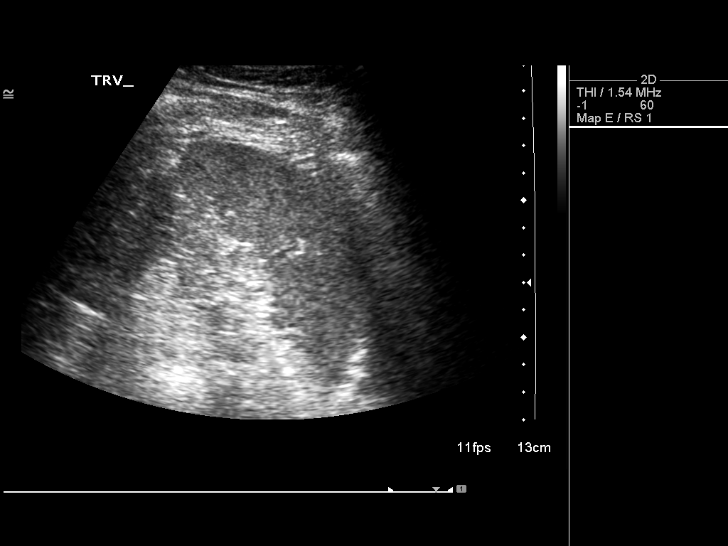

[13 of 25 positions shown; findings below may reference images not displayed]

FINDINGS: Gallbladder:  No shadowing gallstones or echogenic sludge.  No
gallbladder wall thickening or pericholecystic fluid.  Negative
sonographic Murphy's sign according to the ultrasound technologist.

Common Bile Duct:  Normal caliber of 3 mm

Liver:  The liver shows mild, diffuse increase of echotexture,
likely reflecting fatty infiltration.  No focal hepatic mass or
intrapelvic biliary ductal dilatation is identified.  The portal
vein shows normal patency.

IVC:  Patent throughout its visualized course in the abdomen.

Pancreas:  Normal visualized pancreas.  Pancreatic tail is
obscured.

Spleen:  The spleen shows normal echotexture and size.

Kidneys:  Right kidney measures 12.1 cm and the left kidney
cm.  Both have normal sonographic appearance.

Abdominal Aorta:  Normal in caliber throughout its visualized
course in the abdomen.
IMPRESSION: Increased echogenicity of the liver, likely reflecting fatty
infiltration.  Normal gallbladder and bile ducts.

## 2010-11-11 LAB — URINALYSIS, ROUTINE W REFLEX MICROSCOPIC
Bilirubin Urine: NEGATIVE
Glucose, UA: NEGATIVE
Hgb urine dipstick: NEGATIVE
Ketones, ur: 15 — AB
Nitrite: NEGATIVE
Protein, ur: NEGATIVE
Specific Gravity, Urine: >1.03 — ABNORMAL HIGH
Urobilinogen, UA: 0.2
pH: 6

## 2010-11-11 LAB — RH IMMUNE GLOBULIN WORKUP (NOT WOMEN'S HOSP)
ABO/RH(D): A NEG
Antibody Screen: NEGATIVE

## 2010-11-11 LAB — GLUCOSE TOLERANCE, 1 HOUR: Glucose, 1 Hour GTT: 119

## 2011-01-16 ENCOUNTER — Ambulatory Visit (INDEPENDENT_AMBULATORY_CARE_PROVIDER_SITE_OTHER): Payer: PRIVATE HEALTH INSURANCE | Admitting: Family Medicine

## 2011-01-16 ENCOUNTER — Encounter: Payer: Self-pay | Admitting: Family Medicine

## 2011-01-16 ENCOUNTER — Encounter: Payer: Self-pay | Admitting: *Deleted

## 2011-01-16 VITALS — BP 110/70 | HR 92 | Temp 98.8°F | Ht 67.0 in | Wt 215.0 lb

## 2011-01-16 DIAGNOSIS — J45901 Unspecified asthma with (acute) exacerbation: Secondary | ICD-10-CM

## 2011-01-16 DIAGNOSIS — J111 Influenza due to unidentified influenza virus with other respiratory manifestations: Secondary | ICD-10-CM

## 2011-01-16 MED ORDER — ALBUTEROL SULFATE HFA 108 (90 BASE) MCG/ACT IN AERS: 2.0000 | INHALATION_SPRAY | Freq: Four times a day (QID) | RESPIRATORY_TRACT | Status: DC | PRN

## 2011-01-16 NOTE — Progress Notes (Signed)
  Patient Name: Sharon Kerr Date of Birth: 1975-10-22 Age: 35 y.o. Medical Record Number: [id number] Gender: female  History of Present Illness:  Sharon Kerr is a 35 y.o. very pleasant female patient who presents with the following:  2 months --- felt like a cold and some congestion and some coughing came later. More like a dry annoying cough. Sometimes it would be OK. Sometimes coughing almost continuously. Vomitting three times a day. Today --- now with diarrhea. (Started some old Augmentin that they had at home) Muscle aches started this week.   No flu shot this year.   Has been coughing more over the last couple of months. Initially a couple of months ago with URI sx, then with prolonged intermittent cough. Distinctly different symptoms began in the last week -- diffuse arthralgias, myalgia, fever, and aching all over  Past Medical History, Surgical History, Social History, Family History, and Problem List have been reviewed in EHR and updated if relevant.  Review of Systems: ROS: GEN: Acute illness details above GI: Tolerating PO intake GU: maintaining adequate hydration and urination Pulm: No SOB Interactive and getting along well at home.  Otherwise, ROS is as per the HPI.   Physical Examination: Filed Vitals:   01/16/11 1112  BP: 110/70  Pulse: 92  Temp: 98.8 F (37.1 C)  TempSrc: Oral  Height: 5\' 7"  (1.702 m)  Weight: 215 lb (97.523 kg)  SpO2: 98%    Body mass index is 33.67 kg/(m^2).   Gen: WDWN, NAD; A & O x3, cooperative. Pleasant.Globally Non-toxic HEENT: Normocephalic and atraumatic. Throat clear, w/o exudate, R TM clear, L TM - good landmarks, No fluid present. rhinnorhea. No frontal or maxillary sinus T. MMM NECK: Anterior cervical  LAD is present CV: RRR, No M/G/R, cap refill <2 sec PULM: Breathing comfortably in no respiratory distress. no wheezing, crackles, rhonchi ABD: S,NT,ND,+BS. No HSM. No rebound. EXT: No c/c/e PSYCH: Friendly, good  eye contact MSK: Nml gait   Assessment and Plan: 1. Influenza with respiratory manifestation other than pneumonia   2. Asthma exacerbation, mild     The patient's clinical exam and history is consistent with a diagnosis of influenza.  I discussed with the patient that the CDC and [place] Infectious disease personel are recommended that we not do the rapid influenza screening test on people that have a clinical history and exam consistent with the diagnosis.  Supportive care. Fluids. Cough medicines as needed  Anti-pyretics. Detailed in p/i  Infection control emphasized, including OOW or school until AF 35 hours.   Some of the prolonged cough I suspect is cough from asthma, but cannot fully exclude pertussis. Will let influenza run its course and they will call next week if significant coughing persists.  Ventolin prn along with cough meds

## 2011-07-06 ENCOUNTER — Ambulatory Visit (INDEPENDENT_AMBULATORY_CARE_PROVIDER_SITE_OTHER): Payer: BC Managed Care – PPO | Admitting: Family Medicine

## 2011-07-06 ENCOUNTER — Encounter: Payer: Self-pay | Admitting: Family Medicine

## 2011-07-06 VITALS — BP 123/89 | HR 84 | Temp 98.7°F

## 2011-07-06 DIAGNOSIS — R5381 Other malaise: Secondary | ICD-10-CM

## 2011-07-06 DIAGNOSIS — R609 Edema, unspecified: Secondary | ICD-10-CM

## 2011-07-06 DIAGNOSIS — R42 Dizziness and giddiness: Secondary | ICD-10-CM

## 2011-07-06 DIAGNOSIS — Z3202 Encounter for pregnancy test, result negative: Secondary | ICD-10-CM

## 2011-07-06 DIAGNOSIS — I359 Nonrheumatic aortic valve disorder, unspecified: Secondary | ICD-10-CM

## 2011-07-06 DIAGNOSIS — R635 Abnormal weight gain: Secondary | ICD-10-CM

## 2011-07-06 DIAGNOSIS — I351 Nonrheumatic aortic (valve) insufficiency: Secondary | ICD-10-CM

## 2011-07-06 DIAGNOSIS — T148XXA Other injury of unspecified body region, initial encounter: Secondary | ICD-10-CM

## 2011-07-06 DIAGNOSIS — E039 Hypothyroidism, unspecified: Secondary | ICD-10-CM

## 2011-07-06 DIAGNOSIS — R002 Palpitations: Secondary | ICD-10-CM

## 2011-07-06 DIAGNOSIS — R0602 Shortness of breath: Secondary | ICD-10-CM

## 2011-07-06 HISTORY — DX: Nonrheumatic aortic (valve) insufficiency: I35.1

## 2011-07-06 LAB — POCT URINALYSIS DIPSTICK
Glucose, UA: NEGATIVE
Ketones, UA: NEGATIVE
Leukocytes, UA: NEGATIVE
Nitrite, UA: NEGATIVE
Protein, UA: NEGATIVE
Spec Grav, UA: 1.02
pH, UA: 6

## 2011-07-06 LAB — POCT URINE PREGNANCY: Preg Test, Ur: NEGATIVE

## 2011-07-06 NOTE — Patient Instructions (Signed)
REFERRAL: GO THE THE FRONT ROOM AT THE ENTRANCE OF OUR CLINIC, NEAR CHECK IN. ASK FOR Sharon Kerr. SHE WILL HELP YOU SET UP YOUR REFERRAL. DATE: TIME:  

## 2011-07-06 NOTE — Progress Notes (Signed)
[facility] at [facility]  Patient Name: Sharon Kerr Date of Birth: [birth year] Age: 36 y.o. Medical Record Number: [id number] Gender: female Date of Encounter: 07/06/2011  History of Present Illness:  Sharon Kerr is a 36 y.o. very pleasant female patient who presents with the following:  The patient presents with a multitude of complaints today. She is having some diffuse swelling and edema, and has had a 50 pound weight gain over one year unintentionally. She is symptomatically now on thyroid medication, prescribed by her obstetrician. She has not had her thyroid level checked in one year.  The swelling is painful and causing her some tightness in her skin. She is also having some significant shortness of breath with minimal exertion. She has a known history of aortic regurgitation that was diagnosed when she was approximately 36 years old. She has not seen a cardiologist in approximately 10 years.  She also is having some palpitations and fluttering sensation in her chest, and will become lightheaded and like she's been to pass out. This is not with exertion and can occur at any time. It will last for multiple seconds and is not a one or 2 beat phenomenon.  She denies any recreational drug use.  Went on thyroid medication, on synthroid now.   Woke up with swelling hands. Feet a great deal. When straightened out  Swelling is painful  No period, rarely sexually active.  HCG -- 0.6--0.7 Collene Gobble did go go on some cycles of HCG, this was approximately 2 years ago Synthroid - 1 year ago. 6-7 months ago.   Three weeks has had some palpitations 50 pound - weight gain within the year.   Dr. Rana Snare -  Took OTC diuretic  Endocrinologist at [facility] on Wednesday.   Short of breath walking in here  Past Medical History, Surgical History, Social History, Family History, Problem List, Medications, and Allergies have been reviewed and updated if relevant.  Prior to Admission  medications   Medication Sig Start Date End Date Taking? Authorizing Provider  levothyroxine (SYNTHROID, LEVOTHROID) 50 MCG tablet Take 50 mcg by mouth daily.   Yes Historical Provider, MD  albuterol (PROVENTIL HFA;VENTOLIN HFA) 108 (90 BASE) MCG/ACT inhaler Inhale 2 puffs into the lungs every 6 (six) hours as needed for wheezing. 01/16/11 01/16/12  Hannah Beat, MD  colchicine 0.6 MG tablet Take 0.6 mg by mouth daily.      Historical Provider, MD  dextromethorphan-guaiFENesin (MUCINEX DM) 30-600 MG per 12 hr tablet Take 1 tablet by mouth every 12 (twelve) hours.      Historical Provider, MD  Lorita Officer Triphasic (TRI-LO-SPRINTEC PO) Take 0.025 mg by mouth daily.      Historical Provider, MD    Review of Systems: As above. No chest pain. Shortness of breath as noted. Edema is noted. Otherwise as above. No nausea, vomiting, diarrhea. Absence of menses.  Physical Examination: Filed Vitals:   07/06/11 1547  BP: 123/89  Pulse: 84  Temp: 98.7 F (37.1 C)   Filed Vitals:   There is no height or weight on file to calculate BMI.   GEN: WDWN, NAD, Non-toxic, A & O x 3 HEENT: Atraumatic, Normocephalic. Neck supple. No masses, No LAD. Ears and Nose: No external deformity. CV: RRR, No M/G/R. No JVD. No thrill. No extra heart sounds. PULM: CTA B, no wheezes, crackles, rhonchi. No retractions. No resp. distress. No accessory muscle use. EXTR: No c/c/e NEURO Normal gait.  PSYCH: Normally interactive. Conversant. Not depressed or anxious appearing.  Calm demeanor.    Assessment and Plan:  1. Palpitation  EKG 12-Lead, EKG 12-Lead, Ambulatory referral to Cardiology  2. Edema  POCT urinalysis dipstick, CBC with Differential, Hepatic function panel, Ambulatory referral to Cardiology  3. Shortness of breath  Basic metabolic panel, Brain natriuretic peptide, Ambulatory referral to Cardiology  4. Aortic regurgitation  Ambulatory referral to Cardiology  5. Episodic lightheadedness    6.  Weight gain, abnormal    7. Bruising  CBC with Differential, Protime-INR  8. Other malaise and fatigue  Basic metabolic panel, Hepatic function panel  9. Hypothyroid  TSH  10. Negative pregnancy test  POCT urine pregnancy   Multiple problems.  EKG: Normal sinus rhythm. Normal axis, normal R wave progression, No acute ST elevation or depression.   50 pound weight gain with shortness of breath with minimal exertion and a 52 her old. Obtain pro BNP, and will refer to cardiology. She is also having some palpitations, and a presyncopal sort of event during these palpitations.  Hypothyroidism, check TSH. Patient has follow up with endocrine next week.  Additionally, we'll check her kidney function liver function to evaluate for her edema and should these organs are working properly.  Orders Today: Orders Placed This Encounter  Procedures  . Basic metabolic panel  . CBC with Differential  . Hepatic function panel  . Protime-INR  . TSH  . Brain natriuretic peptide  . Ambulatory referral to Cardiology    Referral Priority:  Routine    Referral Type:  Consultation    Referral Reason:  Specialty Services Required    Requested Specialty:  Cardiology    Number of Visits Requested:  1  . POCT urinalysis dipstick  . POCT urine pregnancy  . EKG 12-Lead    Standing Status: Standing     Number of Occurrences: 1     Standing Expiration Date:     Order Specific Question:  Reason for Exam    Answer:  palpitation    Medications Today: Meds ordered this encounter  Medications  . levothyroxine (SYNTHROID, LEVOTHROID) 50 MCG tablet    Sig: Take 50 mcg by mouth daily.     Results for orders placed in visit on 07/06/11  POCT URINALYSIS DIPSTICK      Component Value Range   Color, UA yellow     Clarity, UA clear     Glucose, UA negative     Bilirubin, UA negative     Ketones, UA negative     Spec Grav, UA 1.020     Blood, UA negative     pH, UA 6.0     Protein, UA negative      Urobilinogen, UA negative     Nitrite, UA negative     Leukocytes, UA Negative    POCT URINE PREGNANCY      Component Value Range   Preg Test, Ur Negative       Hannah Beat, MD 07/06/2011 4:11 PM

## 2011-07-07 LAB — CBC WITH DIFFERENTIAL/PLATELET
Basophils Relative: 0.6 % (ref 0.0–3.0)
Eosinophils Absolute: 0.5 10*3/uL (ref 0.0–0.7)
Monocytes Absolute: 0.6 10*3/uL (ref 0.1–1.0)
Monocytes Relative: 6.5 % (ref 3.0–12.0)
Neutro Abs: 5.2 10*3/uL (ref 1.4–7.7)
RDW: 13.5 % (ref 11.5–14.6)

## 2011-07-07 LAB — BASIC METABOLIC PANEL
BUN: 20 mg/dL (ref 6–23)
CO2: 23 mEq/L (ref 19–32)
Chloride: 108 mEq/L (ref 96–112)
Creatinine, Ser: 0.7 mg/dL (ref 0.4–1.2)
GFR: 98.91 mL/min (ref >60.00)
Glucose, Bld: 80 mg/dL (ref 70–99)
Potassium: 4 mEq/L (ref 3.5–5.1)
Sodium: 141 mEq/L (ref 135–145)

## 2011-07-07 LAB — PROTIME-INR
INR: 1 ratio (ref 0.8–1.0)
Prothrombin Time: 11 s (ref 10.2–12.4)

## 2011-07-07 LAB — HEPATIC FUNCTION PANEL
ALT: 130 U/L — ABNORMAL HIGH (ref 0–35)
Albumin: 3.9 g/dL (ref 3.5–5.2)

## 2011-07-08 ENCOUNTER — Encounter: Payer: Self-pay | Admitting: *Deleted

## 2011-07-08 ENCOUNTER — Other Ambulatory Visit (INDEPENDENT_AMBULATORY_CARE_PROVIDER_SITE_OTHER): Payer: BC Managed Care – PPO

## 2011-07-08 DIAGNOSIS — E041 Nontoxic single thyroid nodule: Secondary | ICD-10-CM | POA: Insufficient documentation

## 2011-07-08 DIAGNOSIS — R748 Abnormal levels of other serum enzymes: Secondary | ICD-10-CM

## 2011-07-08 DIAGNOSIS — E669 Obesity, unspecified: Secondary | ICD-10-CM | POA: Insufficient documentation

## 2011-07-08 DIAGNOSIS — R7401 Elevation of levels of liver transaminase levels: Secondary | ICD-10-CM

## 2011-07-09 LAB — HEPATITIS PANEL, ACUTE
HCV Ab: NEGATIVE
Hep B C IgM: NEGATIVE
Hepatitis B Surface Ag: NEGATIVE

## 2011-07-10 ENCOUNTER — Telehealth: Payer: Self-pay | Admitting: *Deleted

## 2011-07-10 NOTE — Telephone Encounter (Signed)
Patient says she went to [facility] to the Endocrinologist and they are asking for a copy of her most recent lipid panel to be faxed to [phone].  Is that okay?

## 2011-07-13 NOTE — Telephone Encounter (Signed)
Please, can you do this?

## 2011-07-14 NOTE — Telephone Encounter (Signed)
She came by and picked the others up already for her appt

## 2011-07-14 NOTE — Telephone Encounter (Signed)
We do not have a lipid panel on her. What do you want me to do?

## 2011-07-14 NOTE — Telephone Encounter (Signed)
Send all of most recent labs - there is no lipid

## 2011-07-24 ENCOUNTER — Ambulatory Visit: Payer: BC Managed Care – PPO | Admitting: Cardiovascular Disease

## 2011-08-21 ENCOUNTER — Ambulatory Visit: Payer: BC Managed Care – PPO | Admitting: Cardiovascular Disease

## 2012-04-13 ENCOUNTER — Other Ambulatory Visit: Payer: Self-pay | Admitting: Obstetrics and Gynecology

## 2012-10-12 ENCOUNTER — Ambulatory Visit: Payer: BC Managed Care – PPO | Admitting: Family Medicine

## 2012-12-15 ENCOUNTER — Ambulatory Visit (INDEPENDENT_AMBULATORY_CARE_PROVIDER_SITE_OTHER): Payer: BC Managed Care – PPO | Admitting: Family Medicine

## 2012-12-15 ENCOUNTER — Encounter: Payer: Self-pay | Admitting: Family Medicine

## 2012-12-15 VITALS — HR 103 | Temp 98.4°F | Ht 67.0 in

## 2012-12-15 DIAGNOSIS — F4010 Social phobia, unspecified: Secondary | ICD-10-CM

## 2012-12-15 DIAGNOSIS — M79674 Pain in right toe(s): Secondary | ICD-10-CM

## 2012-12-15 DIAGNOSIS — M79609 Pain in unspecified limb: Secondary | ICD-10-CM

## 2012-12-15 DIAGNOSIS — F410 Panic disorder [episodic paroxysmal anxiety] without agoraphobia: Secondary | ICD-10-CM

## 2012-12-15 DIAGNOSIS — J301 Allergic rhinitis due to pollen: Secondary | ICD-10-CM

## 2012-12-15 MED ORDER — ESCITALOPRAM OXALATE 5 MG PO TABS: 5.0000 mg | ORAL_TABLET | Freq: Every day | ORAL | Status: DC

## 2012-12-15 MED ORDER — PROPRANOLOL HCL 10 MG PO TABS: ORAL_TABLET | ORAL | Status: DC

## 2012-12-15 NOTE — Progress Notes (Signed)
Date:  12/15/2012   Name:  Sharon Kerr   DOB:  02/22/1975   MRN:  [id number] Gender: female Age: 37 y.o.  Primary Physician:  Hannah Beat, MD   Chief Complaint: Foot Pain   Subjective:   History of Present Illness:  Sharon Kerr is a 37 y.o. pleasant patient who presents with the following:  Elevated BP: 177/115 on machine 135/85 on my recheck with thigh cuff after 10 minutes  2 weeks ago, went to a party. For about 2 weeks, pain on the ball of the foot. Has been walking on lateral aspect of her foot.  R 2nd toe capsular tear. Pain and swelling mostly around plantar 2 and 3 heads  Hates going to crowds.  Social: she brought up the significant problem. She's been having long-standing panic attacks in difficulty with going to the social gatherings including going to church 2 or 3 times a week, some difficulties in the workplace, going to social of events where she has to meet and talk to people and shake hands. She denies depression. Her father also actually has some anxiety and panic attacks, and he was recently started on Lexapro.  Patient Active Problem List   Diagnosis Date Noted  . Social anxiety disorder 12/16/2012  . Panic attacks 12/16/2012  . Allergic rhinitis due to pollen   . Aortic regurgitation 07/06/2011  . ACUTE GOUTY ARTHROPATHY 10/25/2009  . HYPOTHYROIDISM 11/23/2007  . ASTHMA 11/23/2007    Past Medical History  Diagnosis Date  . Asthma   . Thyroid disease   . Migraine   . Allergy   . Heart murmur   . History of high blood pressure     without diag  . Aortic regurgitation 07/06/2011  . Panic attacks 12/16/2012  . Social anxiety disorder 12/16/2012    Past Surgical History  Procedure Laterality Date  . Thyroidectomy, partial  2009    [facility]  . Cesarean section    . Foot surgery  2001  . Breast enhancement surgery  1999  . [place]      History   Social History  . Marital Status: Married    Spouse Name: N/A    Number of Children: 1   . Years of Education: N/A   Occupational History  . Art Director    Social History Main Topics  . Smoking status: Never Smoker   . Smokeless tobacco: Never Used  . Alcohol Use: No  . Drug Use: No  . Sexual Activity: Not on file   Other Topics Concern  . Not on file   Social History Narrative  . No narrative on file    No family history on file.  Allergies  Allergen Reactions  . Codeine     Medication list has been reviewed and updated.  Review of Systems:   GEN: No acute illnesses, no fevers, chills. GI: No n/v/d, eating normally Pulm: No SOB Interactive and getting along well at home. Otherwise, ROS is as per the HPI.  Objective:   Physical Examination: Pulse 103  Temp(Src) 98.4 F (36.9 C) (Oral)  Ht 5\' 7"  (1.702 m)  LMP 11/28/2012  Ideal Body Weight: Weight in (lb) to have BMI = 25: 159.3   GEN: WDWN, NAD, Non-toxic, A & O x 3 HEENT: Atraumatic, Normocephalic. Neck supple. No masses, No LAD. Ears and Nose: No external deformity. CV: RRR, No M/G/R. No JVD. No thrill. No extra heart sounds. PULM: CTA B, no wheezes, crackles, rhonchi. No retractions. No resp. distress.  No accessory muscle use. EXTR: No c/c/e NEURO Normal gait.  PSYCH: Normally interactive. Conversant. Mildly anxious.  Calm demeanor.   RIGHT foot shows a stable ankle was negative anterior drawer and subtalar tilt testing. All bones of the mid foot and ankle are nontender. All ligaments are stable. There is some tenderness and swelling around the 2nd primarily as well as some of the 3rd MTP. There is pain with movement and dorsal or plantar direction and there is some pain more on the plantar aspect of the knee. Otherwise unremarkable. No significant fit tenderness. No perineal or posterior tib tenderness, Achilles, or calcaneal tenderness.  Laboratory and Imaging Data:  Assessment & Plan:    Toe pain, right  Social anxiety disorder  Panic attacks  Allergic rhinitis due to  pollen  I am not overly concerned regarding her toe pain, which is likely a capsular injury while at her party. I suggested a stiff shoe, and she may have appropriate home. If not, she will call, and we can easily give her a postoperative shoe.  She sounds like she has had some long-standing intermittent panic attacks, most significant around social times, consistent with social anxiety disorder with some intermittent panic attacks. Discussed all this in detail with her. She does not appear to be a depressed on top of this. I recommended that we start 5 mg of Lexapro and titrate this up. Also gave her some Inderal that she can use prior to speaking engagements or social settings. Follow up with me in 6 weeks.  There are no Patient Instructions on file for this visit.  Orders Today:  No orders of the defined types were placed in this encounter.    New medications, updates to list, dose adjustments: Meds ordered this encounter  Medications  . escitalopram (LEXAPRO) 5 MG tablet    Sig: Take 1 tablet (5 mg total) by mouth daily. Increase to 2 tabs after 2 weeks    Dispense:  60 tablet    Refill:  2  . propranolol (INDERAL) 10 MG tablet    Sig: 1 tab po prn as directed    Dispense:  30 tablet    Refill:  3    Signed,  Shandee Jergens T. Teasha Murrillo, MD, CAQ Sports Medicine  [facility] [address] [place] [address] Phone: [phone] Fax: [phone]  Updated Complete Medication List:   Medication List       This list is accurate as of: 12/15/12 11:59 PM.  Always use your most recent med list.               escitalopram 5 MG tablet  Commonly known as:  LEXAPRO  Take 1 tablet (5 mg total) by mouth daily. Increase to 2 tabs after 2 weeks     levothyroxine 50 MCG tablet  Commonly known as:  SYNTHROID, LEVOTHROID  Take 50 mcg by mouth daily.     propranolol 10 MG tablet  Commonly known as:  INDERAL  1 tab po prn as directed

## 2012-12-15 NOTE — Progress Notes (Signed)
Pre-visit discussion using our clinic review tool. No additional management support is needed unless otherwise documented below in the visit note.  

## 2012-12-16 ENCOUNTER — Encounter: Payer: Self-pay | Admitting: Family Medicine

## 2012-12-16 DIAGNOSIS — J301 Allergic rhinitis due to pollen: Secondary | ICD-10-CM | POA: Insufficient documentation

## 2012-12-16 DIAGNOSIS — F410 Panic disorder [episodic paroxysmal anxiety] without agoraphobia: Secondary | ICD-10-CM

## 2012-12-16 DIAGNOSIS — F4010 Social phobia, unspecified: Secondary | ICD-10-CM

## 2012-12-16 HISTORY — DX: Social phobia, unspecified: F40.10

## 2012-12-16 HISTORY — DX: Panic disorder (episodic paroxysmal anxiety): F41.0

## 2012-12-28 ENCOUNTER — Ambulatory Visit: Payer: Self-pay | Admitting: Podiatry

## 2013-01-18 ENCOUNTER — Encounter: Payer: Self-pay | Admitting: Family Medicine

## 2013-01-18 ENCOUNTER — Ambulatory Visit (INDEPENDENT_AMBULATORY_CARE_PROVIDER_SITE_OTHER): Payer: BC Managed Care – PPO | Admitting: Family Medicine

## 2013-01-18 VITALS — BP 120/84 | HR 96 | Temp 98.3°F

## 2013-01-18 DIAGNOSIS — R05 Cough: Secondary | ICD-10-CM

## 2013-01-18 DIAGNOSIS — R059 Cough, unspecified: Secondary | ICD-10-CM

## 2013-01-18 MED ORDER — ALBUTEROL SULFATE HFA 108 (90 BASE) MCG/ACT IN AERS: 2.0000 | INHALATION_SPRAY | Freq: Four times a day (QID) | RESPIRATORY_TRACT | Status: DC | PRN

## 2013-01-18 MED ORDER — DOXYCYCLINE HYCLATE 100 MG PO TABS: 100.0000 mg | ORAL_TABLET | Freq: Two times a day (BID) | ORAL | Status: DC

## 2013-01-18 NOTE — Patient Instructions (Signed)
Use the inhaler if needed.  If may make you feel jittery.  Start the antibiotics today.  Take care.  If not gradually improving then notify us.  Glad to see you.

## 2013-01-18 NOTE — Progress Notes (Signed)
Pre-visit discussion using our clinic review tool. No additional management support is needed unless otherwise documented below in the visit note.  H/o seasonal asthma "but I had a pretty good year."  She hasn't had to use SABA in a long time.    She has been sick for about 1 month.  Sneezing, congestion, ear pain, ST, then last week the cough started.  Sputum is dark green and thick.  Some wheeze.  No fevers now, but had some initially.  Cough comes in fits, keeps her up at night.  Taking nyquil for cough/sleep.  Fatigue, atypical for her.    We talked about albuterol potentially making panic sx worse.   Meds, vitals, and allergies reviewed.   ROS: See HPI.  Otherwise, noncontributory.  GEN: nad, alert and oriented HEENT: mucous membranes moist, tm w/o erythema, nasal exam w/o erythema, clear discharge noted,  OP with cobblestoning NECK: supple w/o LA CV: rrr.   PULM: ctab, no inc wob, no rhonchi. No wheeze EXT: no edema SKIN: no acute rash

## 2013-01-19 DIAGNOSIS — R059 Cough, unspecified: Secondary | ICD-10-CM | POA: Insufficient documentation

## 2013-01-19 DIAGNOSIS — R05 Cough: Secondary | ICD-10-CM | POA: Insufficient documentation

## 2013-01-19 NOTE — Assessment & Plan Note (Signed)
Not using BB yet.  D/w pt about SABA vs BB use.  SABA cautions given.  Use prn for wheeze.  Start doxy given the duration and the sputum.  Nontoxic. She agrees.  Fu prn.   See instructions.

## 2013-01-30 ENCOUNTER — Ambulatory Visit: Payer: BC Managed Care – PPO | Admitting: Family Medicine

## 2013-01-30 DIAGNOSIS — Z0289 Encounter for other administrative examinations: Secondary | ICD-10-CM

## 2013-04-07 ENCOUNTER — Other Ambulatory Visit: Payer: Self-pay | Admitting: Family Medicine

## 2013-07-14 ENCOUNTER — Other Ambulatory Visit: Payer: Self-pay | Admitting: Family Medicine

## 2013-07-14 NOTE — Telephone Encounter (Signed)
Last filled 04/07/13 #60 x 2

## 2013-10-25 ENCOUNTER — Other Ambulatory Visit: Payer: Self-pay | Admitting: Family Medicine

## 2013-10-25 NOTE — Telephone Encounter (Signed)
Last filled 07/14/13

## 2013-10-26 ENCOUNTER — Other Ambulatory Visit: Payer: Self-pay | Admitting: Family Medicine

## 2014-02-28 ENCOUNTER — Other Ambulatory Visit: Payer: Self-pay | Admitting: Family Medicine

## 2014-02-28 NOTE — Telephone Encounter (Signed)
Last office visit 01/18/2013 with Dr. Para Marchuncan for cough.  No future appointments scheduled.  Refill?

## 2014-04-11 ENCOUNTER — Other Ambulatory Visit: Payer: Self-pay | Admitting: Obstetrics and Gynecology

## 2014-04-12 LAB — CYTOLOGY - PAP

## 2014-04-24 ENCOUNTER — Other Ambulatory Visit: Payer: Self-pay | Admitting: Family Medicine

## 2014-04-24 NOTE — Telephone Encounter (Signed)
Please call and scheduled CPE with Dr. Patsy Lageropland sometime in the next three months.  She needs this appointment in order to keep getting refills on her medications.

## 2014-04-24 NOTE — Telephone Encounter (Signed)
Ok to refill 3 mo worth.  F/u for cpx next few months

## 2014-04-24 NOTE — Telephone Encounter (Signed)
Last office visit 01/18/2013 with Dr. Para Marchuncan.  No future appointments scheduled.  Refill?

## 2014-04-26 NOTE — Telephone Encounter (Signed)
Pt scheduled 5/16 for labs and 5/23 for cpe

## 2014-05-10 ENCOUNTER — Other Ambulatory Visit: Payer: Self-pay | Admitting: Obstetrics and Gynecology

## 2014-05-26 ENCOUNTER — Ambulatory Visit (INDEPENDENT_AMBULATORY_CARE_PROVIDER_SITE_OTHER): Payer: BLUE CROSS/BLUE SHIELD | Admitting: Family Medicine

## 2014-05-26 ENCOUNTER — Encounter: Payer: Self-pay | Admitting: Family Medicine

## 2014-05-26 ENCOUNTER — Ambulatory Visit (HOSPITAL_COMMUNITY)
Admission: RE | Admit: 2014-05-26 | Discharge: 2014-05-26 | Disposition: A | Discharge status: Home / Self Care | Payer: BLUE CROSS/BLUE SHIELD | Source: Ambulatory Visit | Attending: Family Medicine | Admitting: Family Medicine

## 2014-05-26 VITALS — BP 140/100 | HR 104 | Temp 98.0°F

## 2014-05-26 DIAGNOSIS — M79662 Pain in left lower leg: Secondary | ICD-10-CM

## 2014-05-26 DIAGNOSIS — M7989 Other specified soft tissue disorders: Secondary | ICD-10-CM | POA: Diagnosis not present

## 2014-05-26 MED ORDER — MELOXICAM 15 MG PO TABS: 15.0000 mg | ORAL_TABLET | Freq: Every day | ORAL | Status: DC

## 2014-05-26 NOTE — Assessment & Plan Note (Signed)
Patient is having left calf pain. I do believe that this is most likely a muscle tear but with patient's recent travel history do need to rule out a deep venous thrombosis. Patient does have some risk factors including hypothyroidism, hypertension, and obesity with sedentary lifestyle. Patient has been told the possible repercussions if this is a deep venous thrombosis and has elected to go with a Doppler. Called to [facility] has been placed and scheduled.  As long as Doppler is normal patient will be treated for more of the calf injury. Home exercises, icing protocol, as well as compression sleeve. Patient will make these changes and follow-up with myself or primary care provider in 2-3 weeks.

## 2014-05-26 NOTE — Progress Notes (Signed)
Sharon Kerr [address] [place], [place] [address] Phone: [phone] Subjective:     CC: Injury to calf left  [id number] Sharon Kerr is a 39 y.o. female coming in with complaint of calf pain. Patient was traveling on an airplane 3 days ago and started feeling some tightness in her left calf. Patient did have a severe pain and has been difficulty ambulating for the last couple days. Patient notices that there is some swelling as well. Patient was unable to do stairs secondary to the pain. Patient states that this side seems to be more swollen than the other side. Denies any fevers or chills or any abnormal weight loss. Patient also denies any shortness of breath or cough recently. Patient though does not feel like it is quite getting better and is scared that she tore her muscle. Patient's plane trip was 3 hours. Scrubs the pain as a throbbing sensation that can be sharp with certain movements.  Past Medical History  Diagnosis Date  . Asthma   . Thyroid disease   . Migraine   . Allergic rhinitis due to pollen   . Heart murmur     aortic regurg  . History of high blood pressure     without diag  . Aortic regurgitation 07/06/2011  . Panic attacks 12/16/2012  . Social anxiety disorder 12/16/2012   Past Surgical History  Procedure Laterality Date  . Thyroidectomy, partial  2009    [facility]  . Cesarean section    . Foot surgery  2001  . Breast enhancement surgery  1999  . [facility]     History   Social History  . Marital Status: Married    Spouse Name: N/A  . Number of Children: 1  . Years of Education: N/A   Occupational History  . Art Director    Social History Main Topics  . Smoking status: Never Smoker   . Smokeless tobacco: Never Used  . Alcohol Use: No  . Drug Use: No  . Sexual Activity: Not on file   Other Topics Concern  . Not on file   Social History Narrative  . No narrative on file   No family history on file. Allergies    Allergen Reactions  . Codeine        Past medical history, social, surgical and family history all reviewed in electronic medical record.   Review of Systems: No headache, visual changes, nausea, vomiting, diarrhea, constipation, dizziness, abdominal pain, skin rash, fevers, chills, night sweats, weight loss, swollen lymph nodes, body aches, joint swelling, muscle aches, chest pain, shortness of breath, mood changes.   Objective Blood pressure 140/100, pulse 104, temperature 98 F (36.7 C), temperature source Oral, SpO2 94 %.  General: No apparent distress alert and oriented x3 mood and affect normal, dressed appropriately. Obese.  HEENT: Pupils equal, extraocular movements intact  Respiratory: Patient's speak in full sentences and does not appear short of breath  Cardiovascular: Trace lower extremity edema, tender on left, no erythema  Skin: Warm dry intact with no signs of infection or rash on extremities or on axial skeleton.  Abdomen: Soft nontender  Neuro: Cranial nerves II through XII are intact, neurovascularly intact in all extremities with 2+ DTRs and 2+ pulses.  Lymph: No lymphadenopathy of posterior or anterior cervical chain or axillae bilaterally.  Gait normal with good balance and coordination.  MSK:  Non tender with full range of motion and good stability and symmetric strength and tone of  shoulders, elbows, wrist, hip, knees bilaterally.  Ankle: left Moderate swelling of the left calf compared to contralateral side. Both have trace effusion.  Patient is tender to palpation over the proximal lateral gastrocnemius head. Patient was unfortunately also significantly tender in the popliteal fossa. Range of motion is full in all directions. Strength is 5/5 in all directions. Stable lateral and medial ligaments; squeeze test and kleiger test unremarkable; Talar dome nontender; No pain at base of 5th MT; No tenderness over cuboid; No tenderness over N spot or navicular  prominence No tenderness on posterior aspects of lateral and medial malleolus No sign of peroneal tendon subluxations or tenderness to palpation Negative tarsal tunnel tinel's Able to walk 4 steps. Negative Thompson with full strength with dorsiflexion.   Impression and Recommendations:     This case required medical decision making of moderate complexity.

## 2014-05-26 NOTE — Addendum Note (Signed)
Addended by: Tonye BecketAIRRIKIER, Ellysia Char M on: 05/26/2014 11:09 AM   Modules accepted: Orders

## 2014-05-26 NOTE — Progress Notes (Signed)
Pre visit review using our clinic review tool, if applicable. No additional management support is needed unless otherwise documented below in the visit note. 

## 2014-05-26 NOTE — Progress Notes (Signed)
VASCULAR LAB PRELIMINARY  PRELIMINARY  PRELIMINARY  PRELIMINARY  Left lower extremity venous Doppler completed.    Preliminary report:  There is no DVT, SVT, or Baker's cyst noted in the left lower extremity.   Amneet Cendejas, RVT 05/26/2014, 11:28 AM

## 2014-05-26 NOTE — Patient Instructions (Addendum)
Good to see you.  Even though this looks like a calf tear I have to rule out clot. We need an ultrasound. We have got it ordered. Once it is negative start the following.... Ice 20 minutes 2 times daily. Usually after activity and before bed. Consider a compression sleeve at [facility] or omega sports Exercises 3 times a week at least and no more than once daily.  Heel lift in the shoe can help as well.  meloxicam daily for 10 days then as needed.  This will take 2-3 weeks to fully heal.  Follow up with dr copeland or myself in 2-3 weeks.

## 2014-05-28 ENCOUNTER — Ambulatory Visit: Payer: Self-pay | Admitting: Family Medicine

## 2014-05-30 ENCOUNTER — Encounter: Payer: Self-pay | Admitting: Family Medicine

## 2014-05-30 ENCOUNTER — Ambulatory Visit (INDEPENDENT_AMBULATORY_CARE_PROVIDER_SITE_OTHER): Payer: BLUE CROSS/BLUE SHIELD | Admitting: Family Medicine

## 2014-05-30 VITALS — BP 134/96 | HR 102 | Temp 98.6°F | Ht 67.0 in

## 2014-05-30 DIAGNOSIS — S86812D Strain of other muscle(s) and tendon(s) at lower leg level, left leg, subsequent encounter: Secondary | ICD-10-CM | POA: Diagnosis not present

## 2014-05-30 DIAGNOSIS — S86112D Strain of other muscle(s) and tendon(s) of posterior muscle group at lower leg level, left leg, subsequent encounter: Secondary | ICD-10-CM

## 2014-05-30 NOTE — Progress Notes (Signed)
Dr. Karleen HampshireSpencer T. Takao Lizer, MD, CAQ [facility] [address] [place], [id number] Phone: [phone] Fax: [phone]  05/30/2014  Patient: Sharon MiniumChristine Badie, MRN: [id number], DOB: 05/12/1975, 39 y.o.  Primary Physician:  Hannah BeatSpencer Amreen Raczkowski, MD  Chief Complaint: Pain of left calf  Subjective:   Sharon MiniumChristine Plantz is a 39 y.o. very pleasant female patient who presents with the following:  Date of injury: 05/23/2014  The patient tells me that she has had some pain in the left calf off and on for approximately 2 months, but when she was traveling on an airplane 3 days ago she felt some acute tightness and pressure in her left calf almost like someone had shot her. She is now using a cane.  She came to our Saturday clinic, and was evaluated for potential DVT which was grossly negative. She has been also been using a compression sleeve which seems to help. Ultrasound Doppler for DVT was negative on Saturday.  Her blood pressures of also been intermittently elevated.  Past Medical History, Surgical History, Social History, Family History, Problem List, Medications, and Allergies have been reviewed and updated if relevant.  GEN: No fevers, chills. Nontoxic. Primarily MSK c/o today. MSK: Detailed in the HPI GI: tolerating PO intake without difficulty Neuro: No numbness, parasthesias, or tingling associated. Otherwise the pertinent positives of the ROS are noted above.   Objective:   BP 134/96 mmHg  Pulse 102  Temp(Src) 98.6 F (37 C) (Oral)  Ht 5\' 7"  (1.702 m)  Wt   SpO2 98%   GEN: WDWN, NAD, Non-toxic, Alert & Oriented x 3 HEENT: Atraumatic, Normocephalic.  Ears and Nose: No external deformity. EXTR: No clubbing/cyanosis/edema NEURO: antalgic gait PSYCH: Normally interactive. Conversant. Not depressed or anxious appearing.  Calm demeanor.    Left lower extremity is tender to palpation with deep palpation on the region of the  musculocutaneous junction at the gastrocnemius, more on the medial aspect compared to the lateral aspect. The Achilles is intact. The patient essentially has no tenderness proximally. It localizes out to one generalized region in the midportion of the lower extremity.  Radiology: Diagnostic Ultrasound Evaluation Terason t3000, MSK ultrasound, MSK probe Anatomy scanned: LLE Indication: Pain Findings: The calf musculature was scanned longitudinally and transversely with a linear probe. There was evidence of a hypoechoic region in the area of maximal tenderness and can be seen both transversely and longitudinally. Medially, a region of hypoechogenicity with more hyperechoic components that would correspond to torn muscle bellies was identified and reviewed with the patient. Electronically Signed  By: Hannah BeatSpencer Myrah Strawderman, MD On: 05/30/2014 1:27 PM   Assessment and Plan:   Gastrocnemius tear, left, subsequent encounter  >25 minutes spent in face to face time with patient, >50% spent in counselling or coordination of care  Classic tennis leg.  No ballistic movements are stretching for at least 2 months, then began general rehabilitation. She could do at her hand ergometer if she is able to find one.  Reviewed treadmill protocol with the patient. I would do his penicillin her pain level but no sooner certainly than 6 weeks from now.  Patient Instructions  Treadmill: Calf and posterior lower extremity rehab  Forward Walking: Go light 2 mins, easy about 2-3 mph: At Sideways Left: 2 mins, 0.6 - 0.8 mph Sideways Right: 2 mins, 0.6 - 0.8 mph Backwards, 2 mins, 1.8 - 2.2 mph Repeat, several   At the beginning, start out only doing about 5 mins to  see how it goes.  Goal is 30 minute  Start at a 3 degree incline - can slightly lower if necessary When able, increase to 3.5, then 4, then 4.5 (After you comfortably can do 30 minutes)      Signed,  Carrell Palmatier T. Kameka Whan, MD   Patient's Medications    New Prescriptions   No medications on file  Previous Medications   ALBUTEROL (PROVENTIL HFA;VENTOLIN HFA) 108 (90 BASE) MCG/ACT INHALER    Inhale 2 puffs into the lungs every 6 (six) hours as needed for wheezing or shortness of breath.   ESCITALOPRAM (LEXAPRO) 5 MG TABLET    TAKE 2 TABLETS (10 MG TOTAL) BY MOUTH DAILY.   LEVOTHYROXINE (SYNTHROID, LEVOTHROID) 50 MCG TABLET    Take 50 mcg by mouth daily.   MELOXICAM (MOBIC) 15 MG TABLET    Take 1 tablet (15 mg total) by mouth daily.   PROPRANOLOL (INDERAL) 10 MG TABLET    TAKE 1 TABLET BY MOUTH AS NEEDED AS DIRECTED  Modified Medications   No medications on file  Discontinued Medications   No medications on file

## 2014-05-30 NOTE — Patient Instructions (Addendum)
Treadmill: Calf and posterior lower extremity rehab  Forward Walking: Go light 2 mins, easy about 2-3 mph: At Sideways Left: 2 mins, 0.6 - 0.8 mph Sideways Right: 2 mins, 0.6 - 0.8 mph Backwards, 2 mins, 1.8 - 2.2 mph Repeat, several   At the beginning, start out only doing about 5 mins to see how it goes.  Goal is 30 minute  Start at a 3 degree incline - can slightly lower if necessary When able, increase to 3.5, then 4, then 4.5 (After you comfortably can do 30 minutes)

## 2014-05-30 NOTE — Progress Notes (Signed)
Pre visit review using our clinic review tool, if applicable. No additional management support is needed unless otherwise documented below in the visit note. 

## 2014-06-04 ENCOUNTER — Ambulatory Visit (INDEPENDENT_AMBULATORY_CARE_PROVIDER_SITE_OTHER): Payer: BLUE CROSS/BLUE SHIELD | Admitting: Internal Medicine

## 2014-06-04 ENCOUNTER — Encounter: Payer: Self-pay | Admitting: Internal Medicine

## 2014-06-04 VITALS — BP 134/100 | HR 94 | Temp 98.5°F

## 2014-06-04 DIAGNOSIS — J069 Acute upper respiratory infection, unspecified: Secondary | ICD-10-CM

## 2014-06-04 MED ORDER — AZITHROMYCIN 250 MG PO TABS
ORAL_TABLET | ORAL | Status: DC
Start: 2014-06-04 — End: 2014-06-25

## 2014-06-04 MED ORDER — HYDROCODONE-HOMATROPINE 5-1.5 MG/5ML PO SYRP: 5.0000 mL | ORAL_SOLUTION | Freq: Three times a day (TID) | ORAL | Status: DC | PRN

## 2014-06-04 NOTE — Progress Notes (Signed)
HPI  Pt presents to the clinic today with c/o cough, chest congestion, fever and chills. This started 5-7 days ago. The cough is productive of thick brown mucous. She is having some pain in her back which she thinks may be coming from the coughing. It is worse at night when she lays down. She has run fever up to 101.0. They have tried Dayquil, Mucinex, and Nyquil with minimal relief. She has a history of allergies and asthma. She has not had sick contacts. She does not take flu shots.  Review of Systems    Past Medical History  Diagnosis Date  . Asthma   . Thyroid disease   . Migraine   . Allergic rhinitis due to pollen   . Heart murmur     aortic regurg  . History of high blood pressure     without diag  . Aortic regurgitation 07/06/2011  . Panic attacks 12/16/2012  . Social anxiety disorder 12/16/2012    History reviewed. No pertinent family history.  History   Social History  . Marital Status: Married    Spouse Name: N/A  . Number of Children: 1  . Years of Education: N/A   Occupational History  . Art Director    Social History Main Topics  . Smoking status: Never Smoker   . Smokeless tobacco: Never Used  . Alcohol Use: No  . Drug Use: No  . Sexual Activity: Not on file   Other Topics Concern  . Not on file   Social History Narrative    Allergies  Allergen Reactions  . Codeine      Constitutional: Positive fever. Denies headache, fatigue or abrupt weight changes.  HEENT:  Denies eye pain, eye redness, ear pain, ringing in the ears, wax buildup, nasal congestion, runny nose or sore throat. Respiratory: Positive cough and chest congestion. Denies difficulty breathing or shortness of breath.  Cardiovascular: Denies chest pain, chest tightness, palpitations or swelling in the hands or feet.   No other specific complaints in a complete review of systems (except as listed in HPI above).  Objective:  BP 134/100 mmHg  Pulse 94  Temp(Src) 98.5 F (36.9 C)  (Oral)  Wt   SpO2 97%   General: Appears her stated age, ill appearing, in NAD. HEENT: Head: normal shape and size, no sinus tenderness noted; Eyes: sclera white, no icterus, conjunctiva pink, PERRLA and EOMs intact; Ears: Tm's pink but intact, normal light reflex; Nose: mucosa pink and moist, septum midline; Throat/Mouth:  Teeth present, mucosa pink and moist, no exudate noted, no lesions or ulcerations noted.  Neck: Cervical adenopathy noted.  Cardiovascular: Normal rate and rhythm. S1,S2 noted.  No murmur, rubs or gallops noted.  Pulmonary/Chest: Normal effort and positive vesicular breath sounds. No respiratory distress. No wheezes, rales or ronchi noted.      Assessment & Plan:  Upper respiratory infection:  Gets some rest and drink plenty of fluids Ibuprofen or Tylenol as needed for fever or body aches eRx for Azithromax x 5 days RX for Hycodan cough syrup  RTC as needed or if symptoms persist.

## 2014-06-04 NOTE — Progress Notes (Signed)
Pre visit review using our clinic review tool, if applicable. No additional management support is needed unless otherwise documented below in the visit note. 

## 2014-06-04 NOTE — Patient Instructions (Signed)
Upper Respiratory Infection, Adult An upper respiratory infection (URI) is also sometimes known as the common cold. The upper respiratory tract includes the nose, sinuses, throat, trachea, and bronchi. Bronchi are the airways leading to the lungs. Most people improve within 1 week, but symptoms can last up to 2 weeks. A residual cough may last even longer.  CAUSES Many different viruses can infect the tissues lining the upper respiratory tract. The tissues become irritated and inflamed and often become very moist. Mucus production is also common. A cold is contagious. You can easily spread the virus to others by oral contact. This includes kissing, sharing a glass, coughing, or sneezing. Touching your mouth or nose and then touching a surface, which is then touched by another person, can also spread the virus. SYMPTOMS  Symptoms typically develop 1 to 3 days after you come in contact with a cold virus. Symptoms vary from person to person. They may include:  Runny nose.  Sneezing.  Nasal congestion.  Sinus irritation.  Sore throat.  Loss of voice (laryngitis).  Cough.  Fatigue.  Muscle aches.  Loss of appetite.  Headache.  Low-grade fever. DIAGNOSIS  You might diagnose your own cold based on familiar symptoms, since most people get a cold 2 to 3 times a year. Your caregiver can confirm this based on your exam. Most importantly, your caregiver can check that your symptoms are not due to another disease such as strep throat, sinusitis, pneumonia, asthma, or epiglottitis. Blood tests, throat tests, and X-rays are not necessary to diagnose a common cold, but they may sometimes be helpful in excluding other more serious diseases. Your caregiver will decide if any further tests are required. RISKS AND COMPLICATIONS  You may be at risk for a more severe case of the common cold if you smoke cigarettes, have chronic heart disease (such as heart failure) or lung disease (such as asthma), or if  you have a weakened immune system. The very young and very old are also at risk for more serious infections. Bacterial sinusitis, middle ear infections, and bacterial pneumonia can complicate the common cold. The common cold can worsen asthma and chronic obstructive pulmonary disease (COPD). Sometimes, these complications can require emergency medical care and may be life-threatening. PREVENTION  The best way to protect against getting a cold is to practice good hygiene. Avoid oral or hand contact with people with cold symptoms. Wash your hands often if contact occurs. There is no clear evidence that vitamin C, vitamin E, echinacea, or exercise reduces the chance of developing a cold. However, it is always recommended to get plenty of rest and practice good nutrition. TREATMENT  Treatment is directed at relieving symptoms. There is no cure. Antibiotics are not effective, because the infection is caused by a virus, not by bacteria. Treatment may include:  Increased fluid intake. Sports drinks offer valuable electrolytes, sugars, and fluids.  Breathing heated mist or steam (vaporizer or shower).  Eating chicken soup or other clear broths, and maintaining good nutrition.  Getting plenty of rest.  Using gargles or lozenges for comfort.  Controlling fevers with ibuprofen or acetaminophen as directed by your caregiver.  Increasing usage of your inhaler if you have asthma. Zinc gel and zinc lozenges, taken in the first 24 hours of the common cold, can shorten the duration and lessen the severity of symptoms. Pain medicines may help with fever, muscle aches, and throat pain. A variety of non-prescription medicines are available to treat congestion and runny nose. Your caregiver   can make recommendations and may suggest nasal or lung inhalers for other symptoms.  HOME CARE INSTRUCTIONS   Only take over-the-counter or prescription medicines for pain, discomfort, or fever as directed by your  caregiver.  Use a warm mist humidifier or inhale steam from a shower to increase air moisture. This may keep secretions moist and make it easier to breathe.  Drink enough water and fluids to keep your urine clear or pale yellow.  Rest as needed.  Return to work when your temperature has returned to normal or as your caregiver advises. You may need to stay home longer to avoid infecting others. You can also use a face mask and careful hand washing to prevent spread of the virus. SEEK MEDICAL CARE IF:   After the first few days, you feel you are getting worse rather than better.  You need your caregiver's advice about medicines to control symptoms.  You develop chills, worsening shortness of breath, or brown or red sputum. These may be signs of pneumonia.  You develop yellow or brown nasal discharge or pain in the face, especially when you bend forward. These may be signs of sinusitis.  You develop a fever, swollen neck glands, pain with swallowing, or white areas in the back of your throat. These may be signs of strep throat. SEEK IMMEDIATE MEDICAL CARE IF:   You have a fever.  You develop severe or persistent headache, ear pain, sinus pain, or chest pain.  You develop wheezing, a prolonged cough, cough up blood, or have a change in your usual mucus (if you have chronic lung disease).  You develop sore muscles or a stiff neck. Document Released: 07/15/2000 Document Revised: 04/13/2011 Document Reviewed: 04/26/2013 [employer] Patient Information 2015 [employer]. This information is not intended to replace advice given to you by your health care provider. Make sure you discuss any questions you have with your health care provider.  

## 2014-06-18 ENCOUNTER — Other Ambulatory Visit: Payer: Self-pay

## 2014-06-18 ENCOUNTER — Encounter: Payer: Self-pay | Admitting: Family Medicine

## 2014-06-19 ENCOUNTER — Other Ambulatory Visit (INDEPENDENT_AMBULATORY_CARE_PROVIDER_SITE_OTHER): Payer: BLUE CROSS/BLUE SHIELD

## 2014-06-19 ENCOUNTER — Other Ambulatory Visit: Payer: Self-pay | Admitting: Family Medicine

## 2014-06-19 DIAGNOSIS — Z13220 Encounter for screening for lipoid disorders: Secondary | ICD-10-CM

## 2014-06-19 DIAGNOSIS — R5383 Other fatigue: Secondary | ICD-10-CM | POA: Diagnosis not present

## 2014-06-19 DIAGNOSIS — E039 Hypothyroidism, unspecified: Secondary | ICD-10-CM | POA: Diagnosis not present

## 2014-06-19 LAB — BASIC METABOLIC PANEL
BUN: 13 mg/dL (ref 6–23)
CALCIUM: 9.4 mg/dL (ref 8.4–10.5)
CO2: 29 meq/L (ref 19–32)
Chloride: 104 mEq/L (ref 96–112)
Creatinine, Ser: 0.63 mg/dL (ref 0.40–1.20)
GFR: 111.74 mL/min (ref >60.00)
Glucose, Bld: 97 mg/dL (ref 70–99)
Potassium: 4 mEq/L (ref 3.5–5.1)
SODIUM: 139 meq/L (ref 135–145)

## 2014-06-19 LAB — LIPID PANEL
CHOL/HDL RATIO: 5
CHOLESTEROL: 190 mg/dL (ref 0–200)
HDL: 41.7 mg/dL (ref >39.00)
LDL Cholesterol: 116 mg/dL — ABNORMAL HIGH (ref 0–99)
NonHDL: 148.3
Triglycerides: 164 mg/dL — ABNORMAL HIGH (ref 0.0–149.0)
VLDL: 32.8 mg/dL (ref 0.0–40.0)

## 2014-06-19 LAB — CBC WITH DIFFERENTIAL/PLATELET
BASOS ABS: 0.1 10*3/uL (ref 0.0–0.1)
Basophils Relative: 0.7 % (ref 0.0–3.0)
EOS PCT: 6.3 % — AB (ref 0.0–5.0)
Eosinophils Absolute: 0.5 10*3/uL (ref 0.0–0.7)
HEMATOCRIT: 44.3 % (ref 36.0–46.0)
Hemoglobin: 15.2 g/dL — ABNORMAL HIGH (ref 12.0–15.0)
Lymphocytes Relative: 40.7 % (ref 12.0–46.0)
Lymphs Abs: 3.4 10*3/uL (ref 0.7–4.0)
MCHC: 34.2 g/dL (ref 30.0–36.0)
MCV: 87.1 fl (ref 78.0–100.0)
MONOS PCT: 5.8 % (ref 3.0–12.0)
Monocytes Absolute: 0.5 10*3/uL (ref 0.1–1.0)
NEUTROS PCT: 46.5 % (ref 43.0–77.0)
Neutro Abs: 3.9 10*3/uL (ref 1.4–7.7)
Platelets: 249 10*3/uL (ref 150.0–400.0)
RBC: 5.09 Mil/uL (ref 3.87–5.11)
RDW: 13.7 % (ref 11.5–15.5)
WBC: 8.4 10*3/uL (ref 4.0–10.5)

## 2014-06-19 LAB — HEPATIC FUNCTION PANEL
ALK PHOS: 53 U/L (ref 39–117)
ALT: 52 U/L — ABNORMAL HIGH (ref 0–35)
AST: 29 U/L (ref 0–37)
Albumin: 4.1 g/dL (ref 3.5–5.2)
BILIRUBIN DIRECT: 0.2 mg/dL (ref 0.0–0.3)
Total Bilirubin: 1.3 mg/dL — ABNORMAL HIGH (ref 0.2–1.2)
Total Protein: 6.8 g/dL (ref 6.0–8.3)

## 2014-06-19 LAB — TSH: TSH: 1.93 u[IU]/mL (ref 0.35–4.50)

## 2014-06-21 ENCOUNTER — Other Ambulatory Visit: Payer: Self-pay | Admitting: Family Medicine

## 2014-06-25 ENCOUNTER — Encounter: Payer: Self-pay | Admitting: Family Medicine

## 2014-06-25 ENCOUNTER — Ambulatory Visit (INDEPENDENT_AMBULATORY_CARE_PROVIDER_SITE_OTHER): Payer: BLUE CROSS/BLUE SHIELD | Admitting: Family Medicine

## 2014-06-25 VITALS — BP 140/95 | HR 84 | Temp 98.3°F | Ht 67.0 in

## 2014-06-25 DIAGNOSIS — Z0000 Encounter for general adult medical examination without abnormal findings: Secondary | ICD-10-CM | POA: Diagnosis not present

## 2014-06-25 NOTE — Progress Notes (Signed)
Dr. Frederico Hamman T. Emilea Goga, MD, [facility] [place], [address] Phone: [phone] Fax: [phone]  06/25/2014  Patient: Sharon Kerr, MRN: [id number], DOB: February 27, 1975, 39 y.o.  Primary Physician:  Owens Loffler, MD  Chief Complaint: Annual Exam  Subjective:   Sharon Kerr is a 39 y.o. pleasant patient who presents with the following:  Health Maintenance Summary Reviewed and updated, unless pt declines services.  Tobacco History Reviewed. Non-smoker Alcohol: No concerns, no excessive use Exercise Habits: Some activity, rec at least 30 mins 5 times a week - up until recent calf injury STD concerns: none Drug Use: None Menses regular: no Lumps or breast concerns: no Breast Cancer Family History: Mom, 40's  nyquil / dayquil Sudafed last week Cold improving  On metformin.  PCOS. 07/23/2014.  Dr. Lew Dawes at [facility].   Anxiety doing much better. On Lexapro.  Louretta Shorten at [facility].   BP: 145/90.  Labs ok.   Mammos since 67.  Had a d and c.   Wt Readings from Last 3 Encounters:  01/16/11 215 lb (97.523 kg)  10/25/09 215 lb (97.523 kg)  09/12/09 204 lb 6.4 oz (92.715 kg)    No weight measurement in 4 years. Declined each OV.  Health Maintenance  Topic Date Due  . HIV Screening  05/07/1990  . TETANUS/TDAP  05/07/1994  . INFLUENZA VACCINE  09/03/2014  . MAMMOGRAM  06/26/2015  . PAP SMEAR  04/10/2017     There is no immunization history on file for this patient. Patient Active Problem List   Diagnosis Date Noted  . PCOS (polycystic ovarian syndrome) 06/26/2014  . Hypothyroidism associated with surgical procedure   . Social anxiety disorder 12/16/2012  . Panic attacks 12/16/2012  . Allergic rhinitis due to pollen   . Thyroid nodule 07/08/2011  . Aortic regurgitation 07/06/2011  . ACUTE GOUTY ARTHROPATHY 10/25/2009  . ASTHMA 11/23/2007   Past Medical History  Diagnosis Date    . Asthma   . Hypothyroidism associated with surgical procedure   . Migraine   . Allergic rhinitis due to pollen   . History of high blood pressure     without diag  . Aortic regurgitation 07/06/2011  . Panic attacks 12/16/2012  . Social anxiety disorder 12/16/2012  . PCOS (polycystic ovarian syndrome) 06/26/2014   Past Surgical History  Procedure Laterality Date  . Thyroidectomy, partial  2009    [facility]  . Cesarean section    . Foot surgery  2001  . Breast enhancement surgery  1999  . [facility]     History   Social History  . Marital Status: Married    Spouse Name: N/A  . Number of Children: 1  . Years of Education: N/A   Occupational History  . Art Director    Social History Main Topics  . Smoking status: Never Smoker   . Smokeless tobacco: Never Used  . Alcohol Use: No  . Drug Use: No  . Sexual Activity: Not on file   Other Topics Concern  . Not on file   Social History Narrative   Family History  Problem Relation Age of Onset  . Breast cancer Mother 16   Allergies  Allergen Reactions  . Oxycodone Itching and Rash    Other Reaction: Red all over  . Codeine     Medication list has been reviewed and updated.   General: Denies fever, chills, sweats. No significant weight loss. CANNOT LOSE  WEIGHT, FRUSTRATED Eyes: Denies blurring,significant itching ENT: Denies earache, sore throat, and hoarseness.  Cardiovascular: Denies chest pains, palpitations, dyspnea on exertion,  Respiratory: Denies cough, dyspnea at rest,wheeezing Breast: no concerns about lumps GI: Denies nausea, vomiting, diarrhea, constipation, change in bowel habits, abdominal pain, melena, hematochezia GU: Denies dysuria, hematuria, urinary hesitancy, nocturia, denies STD risk, no concerns about discharge Musculoskeletal: Denies back pain, joint pain Derm: Denies rash, itching Neuro: Denies  paresthesias, frequent falls, frequent headaches Psych: Denies depression, anxiety - MUCH, MUCH  BETTER Endocrine: Denies cold intolerance, heat intolerance, polydipsia Heme: Denies enlarged lymph nodes Allergy: No hayfever  Objective:   BP 140/95 mmHg  Pulse 84  Temp(Src) 98.3 F (36.8 C) (Oral)  Ht _0  (1.702 m)  Wt  No exam data present  GEN: well developed, well nourished, no acute distress Eyes: conjunctiva and lids normal, PERRLA, EOMI ENT: TM clear, nares clear, oral exam WNL Neck: supple, no lymphadenopathy, no thyromegaly, no JVD Pulm: clear to auscultation and percussion, respiratory effort normal CV: regular rate and rhythm, S1-S2, no murmur, rub or gallop, no bruits Chest: no scars, masses, no lumps BREAST: breast exam declined GI: soft, non-tender; no hepatosplenomegaly, masses; active bowel sounds all quadrants GU: GU exam declined Lymph: no cervical, axillary or inguinal adenopathy MSK: gait normal, muscle tone and strength WNL, no joint swelling, effusions, discoloration, crepitus  SKIN: clear, good turgor, color WNL, no rashes, lesions, or ulcerations Neuro: normal mental status, normal strength, sensation, and motion Psych: alert; oriented to person, place and time, normally interactive and not anxious or depressed in appearance.   All labs reviewed with patient. Lipids:    Component Value Date/Time   CHOL 190 06/19/2014 1149   TRIG 164.0* 06/19/2014 1149   HDL 41.70 06/19/2014 1149   VLDL 32.8 06/19/2014 1149   CHOLHDL 5 06/19/2014 1149   CBC: CBC Latest Ref Rng 06/19/2014 07/06/2011 03/02/2007  WBC 4.0 - 10.5 K/uL 8.4 9.7 13.2(H)  Hemoglobin 12.0 - 15.0 g/dL 15.2(H) 14.5 12.5  Hematocrit 36.0 - 46.0 % 44.3 43.3 35.6(L)  Platelets 150.0 - 400.0 K/uL 249.0 232.0 195 DELTA CHECK NOTED    Basic Metabolic Panel:    Component Value Date/Time   NA 139 06/19/2014 1149   K 4.0 06/19/2014 1149   CL 104 06/19/2014 1149   CO2 29 06/19/2014 1149   BUN 13 06/19/2014 1149   CREATININE 0.63 06/19/2014 1149   GLUCOSE 97 06/19/2014 1149   CALCIUM 9.4  06/19/2014 1149   Hepatic Function Latest Ref Rng 06/19/2014 07/06/2011 09/12/2009  Total Protein 6.0 - 8.3 g/dL 6.8 7.3 7.0  Albumin 3.5 - 5.2 g/dL 4.1 3.9 4.3  AST 0 - 37 U/L 29 71(H) 18  ALT 0 - 35 U/L 52(H) 130(H) 25  Alk Phosphatase 39 - 117 U/L 53 62 46  Total Bilirubin 0.2 - 1.2 mg/dL 1.3(H) 0.9 1.6(H)  Bilirubin, Direct 0.0 - 0.3 mg/dL 0.2 0.1 0.2    Lab Results  Component Value Date   TSH 1.93 06/19/2014   No results found.  Assessment and Plan:   Healthcare maintenance  Health Maintenance Exam: The patient's preventative maintenance and recommended screening tests for an annual wellness exam were reviewed in full today. Brought up to date unless services declined.  Counselled on the importance of diet, exercise, and its role in overall health and mortality. The patient's FH and SH was reviewed, including their home life, tobacco status, and drug and alcohol status.  Globally, her major issues are weight  management.  She needs to lose weight, and exercise as much as possible to decrease her risk for morbidity and mortality.  Her blood pressure is elevated again.  We had a long discussion about this, and for now I think it is reasonable for her to try to lose weight and have a recheck in 6 months.  She is going to go to see a PCOS expert at [facility] to get their opinion, as well.  Follow-up: Return in about 6 months (around 12/26/2014). Or follow-up in 1 year for complete physical examination  Signed,  Frederico Hamman T. Rance Smithson, MD   Patient's Medications  New Prescriptions   No medications on file  Previous Medications   CALCIPOTRIENE (DOVONOX) 0.005 % OINTMENT    Apply topically 2 (two) times daily.    ESCITALOPRAM (LEXAPRO) 5 MG TABLET    TAKE 2 TABLETS (10 MG TOTAL) BY MOUTH DAILY.   LEVOTHYROXINE (SYNTHROID, LEVOTHROID) 50 MCG TABLET    Take 50 mcg by mouth daily.   PROPRANOLOL (INDERAL) 10 MG TABLET    TAKE 1 TABLET BY MOUTH AS NEEDED AS DIRECTED  Modified Medications     No medications on file  Discontinued Medications   ALBUTEROL (PROVENTIL HFA;VENTOLIN HFA) 108 (90 BASE) MCG/ACT INHALER    Inhale 2 puffs into the lungs every 6 (six) hours as needed for wheezing or shortness of breath.   AZITHROMYCIN (ZITHROMAX) 250 MG TABLET    Take 2 tabs today, then 1 tab daily x 4 days   HYDROCODONE-HOMATROPINE (HYCODAN) 5-1.5 MG/5ML SYRUP    Take 5 mLs by mouth every 8 (eight) hours as needed for cough.   MELOXICAM (MOBIC) 15 MG TABLET    Take 1 tablet (15 mg total) by mouth daily.

## 2014-06-25 NOTE — Progress Notes (Signed)
Pre visit review using our clinic review tool, if applicable. No additional management support is needed unless otherwise documented below in the visit note. 

## 2014-06-26 ENCOUNTER — Encounter: Payer: Self-pay | Admitting: Family Medicine

## 2014-06-26 DIAGNOSIS — E282 Polycystic ovarian syndrome: Secondary | ICD-10-CM

## 2014-06-26 DIAGNOSIS — E890 Postprocedural hypothyroidism: Secondary | ICD-10-CM | POA: Insufficient documentation

## 2014-06-26 HISTORY — DX: Polycystic ovarian syndrome: E28.2

## 2014-10-04 ENCOUNTER — Other Ambulatory Visit: Payer: Self-pay | Admitting: Family Medicine

## 2014-12-26 ENCOUNTER — Ambulatory Visit: Payer: BLUE CROSS/BLUE SHIELD | Admitting: Family Medicine

## 2015-01-04 ENCOUNTER — Other Ambulatory Visit: Payer: Self-pay | Admitting: *Deleted

## 2015-01-04 MED ORDER — ESCITALOPRAM OXALATE 5 MG PO TABS: ORAL_TABLET | ORAL | Status: DC

## 2015-05-02 ENCOUNTER — Ambulatory Visit: Payer: BLUE CROSS/BLUE SHIELD | Admitting: Family Medicine

## 2015-07-03 ENCOUNTER — Ambulatory Visit: Payer: Self-pay | Admitting: Family Medicine

## 2015-09-16 ENCOUNTER — Ambulatory Visit: Payer: BLUE CROSS/BLUE SHIELD | Admitting: Family Medicine

## 2015-11-15 ENCOUNTER — Other Ambulatory Visit: Payer: Self-pay | Admitting: Family Medicine

## 2016-01-23 ENCOUNTER — Ambulatory Visit: Payer: BLUE CROSS/BLUE SHIELD | Admitting: Family Medicine

## 2016-02-05 ENCOUNTER — Ambulatory Visit (INDEPENDENT_AMBULATORY_CARE_PROVIDER_SITE_OTHER): Payer: BLUE CROSS/BLUE SHIELD | Admitting: Family Medicine

## 2016-02-05 ENCOUNTER — Encounter: Payer: Self-pay | Admitting: Family Medicine

## 2016-02-05 VITALS — BP 160/108 | HR 108 | Temp 98.3°F

## 2016-02-05 DIAGNOSIS — B9789 Other viral agents as the cause of diseases classified elsewhere: Secondary | ICD-10-CM | POA: Diagnosis not present

## 2016-02-05 DIAGNOSIS — R05 Cough: Secondary | ICD-10-CM | POA: Diagnosis not present

## 2016-02-05 DIAGNOSIS — J069 Acute upper respiratory infection, unspecified: Secondary | ICD-10-CM | POA: Diagnosis not present

## 2016-02-05 DIAGNOSIS — R062 Wheezing: Secondary | ICD-10-CM

## 2016-02-05 DIAGNOSIS — R059 Cough, unspecified: Secondary | ICD-10-CM

## 2016-02-05 MED ORDER — GUAIFENESIN-CODEINE 100-10 MG/5ML PO SOLN: 5.0000 mL | Freq: Three times a day (TID) | ORAL | 0 refills | Status: DC | PRN

## 2016-02-05 MED ORDER — PREDNISONE 20 MG PO TABS: ORAL_TABLET | ORAL | 0 refills | Status: DC

## 2016-02-05 NOTE — Progress Notes (Signed)
Subjective:    Patient ID: Sharon Kerr, female    DOB: 08/24/1975, 41 y.o.   MRN: [id number]  HPI This is a 41 yo female who presents today with over a month of cough, wheeze. Was seen at [facility] twice and treated with amoxicillin then doxycycline/prednisone. Not currently using inhaler. Does help when she has coughing spell. Had one episode of bloody sputum last night x 1. Chest feels raw. Currently having nasal congestion x 5 days. Started Mucinex Fast Max Severe Cold yesterday- had 12 tablets. Fever to 100, not over 101, some aching. Headaches in temples and over eyes. Has history of asthma and was well controlled without medications prior to this illness. Finished prednisone dose pack 01/24/16. Felt better for a couple of days. 30ight year old son recently sick with uri symptoms. 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  Gyn manages synthroid. Hasn't been seen in awhile, has appointment 05/2016. Has blood pressure cuff at home, takes readings periodically, doesn't remember what they were.   Past Medical History:  Diagnosis Date  . Allergic rhinitis due to pollen   . Aortic regurgitation 07/06/2011  . Asthma   . History of high blood pressure    without diag  . Hypothyroidism associated with surgical procedure   . Migraine   . Panic attacks 12/16/2012  . PCOS (polycystic ovarian syndrome) 06/26/2014  . Social anxiety disorder 12/16/2012   Past Surgical History:  Procedure Laterality Date  . BREAST ENHANCEMENT SURGERY  1999  . CESAREAN SECTION    . FOOT SURGERY  2001  . LEEP    . THYROIDECTOMY, PARTIAL  2009   [facility]   Family History  Problem Relation Age of Onset  . Breast cancer Mother [id number]   Social History  Substance Use Topics  . Smoking status: Never Smoker  . Smokeless tobacco: Never Used  . Alcohol use No      Review of Systems Per HPI    Objective:   Physical Exam  Constitutional: She is oriented to person, place, and  time. She appears well-developed and well-nourished. No distress.  HENT:  Head: Normocephalic and atraumatic.  Right Ear: Tympanic membrane, external ear and ear canal normal.  Left Ear: Tympanic membrane, external ear and ear canal normal.  Nose: Mucosal edema and rhinorrhea present.  Mouth/Throat: Posterior oropharyngeal erythema present. No oropharyngeal exudate or posterior oropharyngeal edema.  Cardiovascular: Normal rate, regular rhythm and normal heart sounds.   Pulmonary/Chest: Effort normal. She has wheezes (scatttered expiratory).  Neurological: She is alert and oriented to person, place, and time.  Skin: Skin is warm and dry. She is not diaphoretic.  Vitals reviewed.     BP (!) 160/108   Pulse (!) 108   Temp 98.3 F (36.8 C)   SpO2 99%  Wt Readings from Last 3 Encounters:  01/16/11 215 lb (97.5 kg)  10/25/09 215 lb (97.5 kg)  09/12/09 204 lb 6.4 oz (92.7 kg)       Assessment & Plan:  1. Cough - guaiFENesin-codeine 100-10 MG/5ML syrup; Take 5 mLs by mouth 3 (three) times daily as needed for cough. (Patient not taking: Reported on 02/07/2016)  Dispense: 120 mL; Refill: 0 - predniSONE (DELTASONE) 20 MG tablet; Take 3 tablets x 3 days, then 2 x 3 days, then 1 x 3 days (Patient not taking: Reported on 02/07/2016)  Dispense: 18 tablet; Refill: 0  2. Wheeze - predniSONE (DELTASONE) 20 MG tablet; Take 3  tablets x 3 days, then 2 x 3 days, then 1 x 3 days. Dispense: 18 tablet; Refill: 0  3. Viral upper respiratory illness - Use your albuterol inhaler every 4-6 hours while you are awake for the next 2-3 days then as needed for cough, chest tightness or wheeze Stop Mucinex fast max and take plain mucinex Use neti pot/saline nasal spray as needed. Can use AFrin twice a day for up to 3 days Ibuprofen/acetaminophen for fever/aches Force fluids If not better in 3 days, start prednisone If not better in 7-10 days or if worse, please come back in.    Olean Ree,  FNP-BC  [facility], [facility]  02/08/2016 10:02 AM

## 2016-02-05 NOTE — Patient Instructions (Signed)
Use your albuterol inhaler every 4-6 hours while you are awake for the next 2-3 days then as needed for cough, chest tightness or wheeze Stop Mucinex fast max and take plain mucinex Use neti pot/saline nasal spray as needed. Can use AFrin twice a day for up to 3 days Ibuprofen/acetaminophen for fever/aches Force fluids If not better in 3 days, start prednisone If not better in 7-10 days or if worse, please come back in.   Asthma, Acute Bronchospasm Acute bronchospasm caused by asthma is also referred to as an asthma attack. Bronchospasm means your air passages become narrowed. The narrowing is caused by inflammation and tightening of the muscles in the air tubes (bronchi) in your lungs. This can make it hard to breathe or cause you to wheeze and cough. What are the causes? Possible triggers are:  Animal dander from the skin, hair, or feathers of animals.  Dust mites contained in house dust.  Cockroaches.  Pollen from trees or grass.  Mold.  Cigarette or tobacco smoke.  Air pollutants such as dust, household cleaners, hair sprays, aerosol sprays, paint fumes, strong chemicals, or strong odors.  Cold air or weather changes. Cold air may trigger inflammation. Winds increase molds and pollens in the air.  Strong emotions such as crying or laughing hard.  Stress.  Certain medicines such as aspirin or beta-blockers.  Sulfites in foods and drinks, such as dried fruits and wine.  Infections or inflammatory conditions, such as a flu, cold, or inflammation of the nasal membranes (rhinitis).  Gastroesophageal reflux disease (GERD). GERD is a condition where stomach acid backs up into your esophagus.  Exercise or strenuous activity. What are the signs or symptoms?  Wheezing.  Excessive coughing, particularly at night.  Chest tightness.  Shortness of breath. How is this diagnosed? Your health care provider will ask you about your medical history and perform a physical exam. A  chest X-ray or blood testing may be performed to look for other causes of your symptoms or other conditions that may have triggered your asthma attack. How is this treated? Treatment is aimed at reducing inflammation and opening up the airways in your lungs. Most asthma attacks are treated with inhaled medicines. These include quick relief or rescue medicines (such as bronchodilators) and controller medicines (such as inhaled corticosteroids). These medicines are sometimes given through an inhaler or a nebulizer. Systemic steroid medicine taken by mouth or given through an IV tube also can be used to reduce the inflammation when an attack is moderate or severe. Antibiotic medicines are only used if a bacterial infection is present. Follow these instructions at home:  Rest.  Drink plenty of liquids. This helps the mucus to remain thin and be easily coughed up. Only use caffeine in moderation and do not use alcohol until you have recovered from your illness.  Do not smoke. Avoid being exposed to secondhand smoke.  You play a critical role in keeping yourself in good health. Avoid exposure to things that cause you to wheeze or to have breathing problems.  Keep your medicines up-to-date and available. Carefully follow your health care provider's treatment plan.  Take your medicine exactly as prescribed.  When pollen or pollution is bad, keep windows closed and use an air conditioner or go to places with air conditioning.  Asthma requires careful medical care. See your health care provider for a follow-up as advised. If you are more than [redacted] weeks pregnant and you were prescribed any new medicines, let your obstetrician know  about the visit and how you are doing. Follow up with your health care provider as directed.  After you have recovered from your asthma attack, make an appointment with your outpatient doctor to talk about ways to reduce the likelihood of future attacks. If you do not have a  doctor who manages your asthma, make an appointment with a primary care doctor to discuss your asthma. Get help right away if:  You are getting worse.  You have trouble breathing. If severe, call your local emergency services (911 in the [place]  You develop chest pain or discomfort.  You are vomiting.  You are not able to keep fluids down.  You are coughing up yellow, green, brown, or bloody sputum.  You have a fever and your symptoms suddenly get worse.  You have trouble swallowing. This information is not intended to replace advice given to you by your health care provider. Make sure you discuss any questions you have with your health care provider. Document Released: 05/06/2006 Document Revised: 07/03/2015 Document Reviewed: 07/27/2012 Elsevier Interactive Patient Education  2017 [employer].

## 2016-02-07 ENCOUNTER — Ambulatory Visit (INDEPENDENT_AMBULATORY_CARE_PROVIDER_SITE_OTHER): Payer: BLUE CROSS/BLUE SHIELD | Admitting: Family Medicine

## 2016-02-07 ENCOUNTER — Encounter: Payer: Self-pay | Admitting: Family Medicine

## 2016-02-07 VITALS — BP 166/100 | HR 110 | Temp 99.1°F

## 2016-02-07 DIAGNOSIS — R509 Fever, unspecified: Secondary | ICD-10-CM | POA: Diagnosis not present

## 2016-02-07 DIAGNOSIS — J4521 Mild intermittent asthma with (acute) exacerbation: Secondary | ICD-10-CM

## 2016-02-07 DIAGNOSIS — I10 Essential (primary) hypertension: Secondary | ICD-10-CM | POA: Diagnosis not present

## 2016-02-07 DIAGNOSIS — J45901 Unspecified asthma with (acute) exacerbation: Secondary | ICD-10-CM | POA: Insufficient documentation

## 2016-02-07 LAB — POC INFLUENZA A&B (BINAX/QUICKVUE)
INFLUENZA A, POC: NEGATIVE
INFLUENZA B, POC: NEGATIVE

## 2016-02-07 MED ORDER — HYDROCHLOROTHIAZIDE 25 MG PO TABS: 25.0000 mg | ORAL_TABLET | Freq: Every day | ORAL | 3 refills | Status: DC

## 2016-02-07 NOTE — Progress Notes (Signed)
Pre visit review using our clinic review tool, if applicable. No additional management support is needed unless otherwise documented below in the visit note. 

## 2016-02-07 NOTE — Patient Instructions (Addendum)
Stop ibuprofen.  Tylenol instead.  Stop afrin. Plain mucinex is okay.  Negative for the flu. Start prednisone taper. Use albuterol prn wheeze.  Likely viral infection.. Given symptoms more time to resolve.    Start HCTZ 25 mg daily. Follow blood pressure at home, goal BP < 140/90.  Keep work on low salt diet, weight loss and regualr exercise.

## 2016-02-07 NOTE — Assessment & Plan Note (Signed)
Peak flows not at goal .  Start prednisone taper. Use albuterol prn wheeze.  Likely viral infection.. Given symptoms more time to resolve. Tylenol for fever.

## 2016-02-07 NOTE — Progress Notes (Signed)
   Subjective:    Patient ID: Sharon Kerr, female    DOB: 09/25/1975, 41 y.o.   MRN: [id number]  HPI    [age] year old female with history of asthma presents for follow up OV on 1/3 for cough. [place].  Reviewed note in detail.   Seen at [facility]  12/2015 ax 2 . Treated with amox then doxycycline/prednisone for bronchitis and sinusitis. Felt better for a few days then symptoms recurred on 12/30. Dx with asthma exac on 02/05/2015 .Marland Kitchen. Treated with albuterol prn, mucinex, nasal saline, afrin, ibuprofen.  Told to follow BPs.   Today pt reports she has not had any improvement.  Last night felt subjective fever, chills. Nasal congestion improved, but dry cough. She does feel wheeze, shortness of breath. Watery eyes. No ear pain, no face pain  Teeth hurting in back. Body aches in both legs. She has been using robitussin D, advil, afrin. Stopped decongestant.    Has history of high blood pressure without diagnosis.  BP Readings from Last 3 Encounters:  02/07/16 (!) 166/100  02/05/16 (!) 160/108  06/26/14 (!) 140/95  At home today 162/105, 130/110.  Has not had flu shot. Has history of HTN as a child.   Review of Systems  Constitutional: Negative for fatigue and fever.  HENT: Negative for ear pain.   Eyes: Negative for pain.  Respiratory: Positive for shortness of breath. Negative for chest tightness.   Cardiovascular: Positive for leg swelling. Negative for chest pain and palpitations.  Gastrointestinal: Negative for abdominal pain.  Genitourinary: Negative for dysuria.       Objective:   Physical Exam  Constitutional: Vital signs are normal. She appears well-developed and well-nourished. She is cooperative.  Non-toxic appearance. She does not appear ill. No distress.  HENT:  Head: Normocephalic.  Right Ear: Hearing, tympanic membrane, external ear and ear canal normal. Tympanic membrane is not erythematous, not retracted and not bulging.  Left Ear: Hearing,  tympanic membrane, external ear and ear canal normal. Tympanic membrane is not erythematous, not retracted and not bulging.  Nose: Mucosal edema and rhinorrhea present. Right sinus exhibits no maxillary sinus tenderness and no frontal sinus tenderness. Left sinus exhibits no maxillary sinus tenderness and no frontal sinus tenderness.  Mouth/Throat: Uvula is midline, oropharynx is clear and moist and mucous membranes are normal.  Eyes: Conjunctivae, EOM and lids are normal. Pupils are equal, round, and reactive to light. Lids are everted and swept, no foreign bodies found.  Neck: Trachea normal and normal range of motion. Neck supple. Carotid bruit is not present. No thyroid mass and no thyromegaly present.  Cardiovascular: Normal rate, regular rhythm, S1 normal, S2 normal, normal heart sounds, intact distal pulses and normal pulses.  Exam reveals no gallop and no friction rub.   No murmur heard.  1 plus bilateral nonpitting edema  Pulmonary/Chest: Effort normal and breath sounds normal. No tachypnea. No respiratory distress. She has no decreased breath sounds. She has no wheezes. She has no rhonchi. She has no rales.  Neurological: She is alert.  Skin: Skin is warm, dry and intact. No rash noted.  Psychiatric: Her speech is normal and behavior is normal. Judgment normal. Her mood appears not anxious. Cognition and memory are normal. She does not exhibit a depressed mood.          Assessment & Plan:

## 2016-02-07 NOTE — Assessment & Plan Note (Addendum)
Likely partially due to meds. Stop afrin and NSAIDs.   New diagnosis except pt had as child. Given level... Start HCTZ and follow up with PCP in 2 weeks. Work on long term healthy lifestyle to lower BP.Marland Kitchen. Weight loss, exercise and heart healthy diet.

## 2016-02-24 ENCOUNTER — Encounter: Payer: Self-pay | Admitting: Family Medicine

## 2016-02-24 ENCOUNTER — Ambulatory Visit (INDEPENDENT_AMBULATORY_CARE_PROVIDER_SITE_OTHER): Payer: BLUE CROSS/BLUE SHIELD | Admitting: Family Medicine

## 2016-02-24 VITALS — BP 136/99 | HR 75 | Temp 98.6°F | Ht 67.0 in

## 2016-02-24 DIAGNOSIS — E890 Postprocedural hypothyroidism: Secondary | ICD-10-CM | POA: Diagnosis not present

## 2016-02-24 DIAGNOSIS — Z79899 Other long term (current) drug therapy: Secondary | ICD-10-CM

## 2016-02-24 DIAGNOSIS — Z13220 Encounter for screening for lipoid disorders: Secondary | ICD-10-CM | POA: Diagnosis not present

## 2016-02-24 DIAGNOSIS — I10 Essential (primary) hypertension: Secondary | ICD-10-CM

## 2016-02-24 LAB — CBC WITH DIFFERENTIAL/PLATELET
Basophils Absolute: 0.1 10*3/uL (ref 0.0–0.1)
Basophils Relative: 0.6 % (ref 0.0–3.0)
EOS ABS: 0.5 10*3/uL (ref 0.0–0.7)
Eosinophils Relative: 6.3 % — ABNORMAL HIGH (ref 0.0–5.0)
HEMATOCRIT: 44.8 % (ref 36.0–46.0)
HEMOGLOBIN: 15.4 g/dL — AB (ref 12.0–15.0)
LYMPHS PCT: 42.9 % (ref 12.0–46.0)
Lymphs Abs: 3.5 10*3/uL (ref 0.7–4.0)
MCHC: 34.3 g/dL (ref 30.0–36.0)
MCV: 87.5 fl (ref 78.0–100.0)
Monocytes Absolute: 0.9 10*3/uL (ref 0.1–1.0)
Monocytes Relative: 11 % (ref 3.0–12.0)
Neutro Abs: 3.2 10*3/uL (ref 1.4–7.7)
Neutrophils Relative %: 39.2 % — ABNORMAL LOW (ref 43.0–77.0)
Platelets: 247 10*3/uL (ref 150.0–400.0)
RBC: 5.12 Mil/uL — AB (ref 3.87–5.11)
RDW: 13.4 % (ref 11.5–15.5)
WBC: 8.1 10*3/uL (ref 4.0–10.5)

## 2016-02-24 LAB — BASIC METABOLIC PANEL
BUN: 13 mg/dL (ref 6–23)
CALCIUM: 9.7 mg/dL (ref 8.4–10.5)
CHLORIDE: 99 meq/L (ref 96–112)
CO2: 31 mEq/L (ref 19–32)
CREATININE: 0.69 mg/dL (ref 0.40–1.20)
GFR: 99.75 mL/min (ref >60.00)
Glucose, Bld: 107 mg/dL — ABNORMAL HIGH (ref 70–99)
Potassium: 3.5 mEq/L (ref 3.5–5.1)
Sodium: 139 mEq/L (ref 135–145)

## 2016-02-24 LAB — HEPATIC FUNCTION PANEL
ALT: 48 U/L — ABNORMAL HIGH (ref 0–35)
AST: 22 U/L (ref 0–37)
Albumin: 4.1 g/dL (ref 3.5–5.2)
Alkaline Phosphatase: 49 U/L (ref 39–117)
BILIRUBIN DIRECT: 0.2 mg/dL (ref 0.0–0.3)
TOTAL PROTEIN: 7 g/dL (ref 6.0–8.3)
Total Bilirubin: 1.1 mg/dL (ref 0.2–1.2)

## 2016-02-24 LAB — LIPID PANEL
CHOL/HDL RATIO: 5
Cholesterol: 181 mg/dL (ref 0–200)
HDL: 37.8 mg/dL — AB (ref >39.00)
LDL Cholesterol: 118 mg/dL — ABNORMAL HIGH (ref 0–99)
NONHDL: 143.04
Triglycerides: 123 mg/dL (ref 0.0–149.0)
VLDL: 24.6 mg/dL (ref 0.0–40.0)

## 2016-02-24 LAB — TSH: TSH: 1.03 u[IU]/mL (ref 0.35–4.50)

## 2016-02-24 NOTE — Progress Notes (Signed)
Dr. Karleen HampshireSpencer T. Johngabriel Verde, MD, CAQ Sports [facility] [address] [place], [address] Phone: [phone] Fax: [phone]  02/24/2016  Patient: Peter MiniumChristine Ciaravino, MRN: [id number], DOB: 06/20/1975, 41 y.o.  Primary Physician:  Hannah BeatSpencer Shayleen Eppinger, MD   Chief Complaint  Patient presents with  . Follow-up    HTN   Subjective:   Peter MiniumChristine Hogg is a 41 y.o. very pleasant female patient who presents with the following:  Dad and GM has some high blood pressure.  Dropped 15 pounds.   HTN: Tolerating all medications without side effects Stable and at goal No CP, no sob. No HA.  BP Readings from Last 3 Encounters:  02/24/16 (!) 136/99  02/07/16 (!) 166/100  02/05/16 (!) 160/108   Thyroid: No symptoms. Labs reviewed. Denies cold / heat intolerance, dry skin, hair loss. No goiter.  Lab Results  Component Value Date   TSH 1.93 06/19/2014    Lost 15 pounds in 2 weeks  Past Medical History, Surgical History, Social History, Family History, Problem List, Medications, and Allergies have been reviewed and updated if relevant.  Patient Active Problem List   Diagnosis Date Noted  . Essential hypertension, benign 02/07/2016  . Asthma with acute exacerbation 02/07/2016  . PCOS (polycystic ovarian syndrome) 06/26/2014  . Hypothyroidism associated with surgical procedure   . Social anxiety disorder 12/16/2012  . Panic attacks 12/16/2012  . Allergic rhinitis due to pollen   . Thyroid nodule 07/08/2011  . Aortic regurgitation 07/06/2011  . ACUTE GOUTY ARTHROPATHY 10/25/2009  . ASTHMA 11/23/2007    Past Medical History:  Diagnosis Date  . Allergic rhinitis due to pollen   . Aortic regurgitation 07/06/2011  . Asthma   . History of high blood pressure    without diag  . Hypothyroidism associated with surgical procedure   . Migraine   . Panic attacks 12/16/2012  . PCOS (polycystic ovarian syndrome) 06/26/2014  . Social anxiety disorder 12/16/2012     Past Surgical History:  Procedure Laterality Date  . BREAST ENHANCEMENT SURGERY  1999  . CESAREAN SECTION    . FOOT SURGERY  2001  . LEEP    . THYROIDECTOMY, PARTIAL  2009   [facility]    Social History   Social History  . Marital status: Married    Spouse name: N/A  . Number of children: 1  . Years of education: N/A   Occupational History  . Art Director    Social History Main Topics  . Smoking status: Never Smoker  . Smokeless tobacco: Never Used  . Alcohol use No  . Drug use: No  . Sexual activity: Not on file   Other Topics Concern  . Not on file   Social History Narrative  . No narrative on file    Family History  Problem Relation Age of Onset  . Breast cancer Mother 6545    Allergies  Allergen Reactions  . Oxycodone Itching and Rash    Other Reaction: Red all over  . Codeine    Medication list reviewed and updated in full in [facility].  GEN: No acute illnesses, no fevers, chills. GI: No n/v/d, eating normally Pulm: No SOB Interactive and getting along well at home.  Otherwise, ROS is as per the HPI.  Objective:   BP (!) 136/99   Pulse 75   Temp 98.6 F (37 C) (Oral)   Ht 5\' 7"  (1.702 m)   GEN: WDWN, NAD, Non-toxic, A & O x  3 HEENT: Atraumatic, Normocephalic. Neck supple. No masses, No LAD. Ears and Nose: No external deformity. CV: RRR, No M/G/R. No JVD. No thrill. No extra heart sounds. PULM: CTA B, no wheezes, crackles, rhonchi. No retractions. No resp. distress. No accessory muscle use. EXTR: No c/c/e NEURO Normal gait.  PSYCH: Normally interactive. Conversant. Not depressed or anxious appearing.  Calm demeanor.   Laboratory and Imaging Data:  Assessment and Plan:   Hypothyroidism associated with surgical procedure - Plan: TSH  Essential hypertension, benign  Screening, lipid - Plan: Lipid panel  Encounter for long-term current use of medication - Plan: Basic metabolic panel, CBC with Differential/Platelet, Hepatic  function panel  Cont weight loss as primary management of HTN - recheck at [facility]  Follow-up: No Follow-up on file.  No orders of the defined types were placed in this encounter.  Medications Discontinued During This Encounter  Medication Reason  . predniSONE (DELTASONE) 20 MG tablet Completed Course  . guaiFENesin-codeine 100-10 MG/5ML syrup Completed Course   Orders Placed This Encounter  Procedures  . Basic metabolic panel  . CBC with Differential/Platelet  . Hepatic function panel  . Lipid panel  . TSH    Signed,  Karleen Hampshire T. Zylon Creamer, MD   Allergies as of 02/24/2016      Reactions   Oxycodone Itching, Rash   Other Reaction: Red all over   Codeine       Medication List       Accurate as of 02/24/16 12:15 PM. Always use your most recent med list.          calcipotriene 0.005 % ointment Commonly known as:  DOVONOX Apply topically 2 (two) times daily.   escitalopram 10 MG tablet Commonly known as:  LEXAPRO Take 1 tablet (10 mg total) by mouth daily. NOTE: New tablets are 10 mg instead of the older 5 mg tabs, only take 1.   hydrochlorothiazide 25 MG tablet Commonly known as:  HYDRODIURIL Take 1 tablet (25 mg total) by mouth daily.   levothyroxine 50 MCG tablet Commonly known as:  SYNTHROID, LEVOTHROID Take 50 mcg by mouth daily.   PROAIR HFA 108 (90 Base) MCG/ACT inhaler Generic drug:  albuterol INHALE 2 PUFFS INTO THE LUNGS EVERY 4-6 HOURS   propranolol 10 MG tablet Commonly known as:  INDERAL TAKE 1 TABLET BY MOUTH AS NEEDED AS DIRECTED

## 2016-02-24 NOTE — Progress Notes (Signed)
Pre visit review using our clinic review tool, if applicable. No additional management support is needed unless otherwise documented below in the visit note. 

## 2016-05-28 ENCOUNTER — Other Ambulatory Visit: Payer: Self-pay | Admitting: Family Medicine

## 2016-08-14 ENCOUNTER — Other Ambulatory Visit: Payer: Self-pay | Admitting: Family Medicine

## 2016-08-19 ENCOUNTER — Ambulatory Visit (INDEPENDENT_AMBULATORY_CARE_PROVIDER_SITE_OTHER): Payer: BLUE CROSS/BLUE SHIELD | Admitting: Family Medicine

## 2016-08-19 DIAGNOSIS — L03115 Cellulitis of right lower limb: Secondary | ICD-10-CM | POA: Diagnosis not present

## 2016-08-19 MED ORDER — SULFAMETHOXAZOLE-TRIMETHOPRIM 800-160 MG PO TABS: 1.0000 | ORAL_TABLET | Freq: Two times a day (BID) | ORAL | 0 refills | Status: DC

## 2016-08-19 NOTE — Progress Notes (Signed)
Subjective:   Patient ID: Sharon Kerr, female    DOB: 05/05/1975, 41 y.o.   MRN: [id number]  Sharon Kerr is a pleasant 41 y.o. year old female patient of Dr. Patsy Lager, new to me, who presents to clinic today with Cellulitis (on 08/03/16 she cut her right foot in [place]. was rxd ORBACTIB  )  on 08/19/2016  HPI:  Was seen in [place] for right lower extremity pain and warmth x 2 days. Had been in the [place] in the days prior and had possible psoriasis lesions on her left leg that had turned into an open sore. Denies fever, chills, nausea or vomiting  Notes reviewed.  Placed in short stay on 08/04/16 for observation and management of right lower extremity cellulitis.  Was given IV Dalvance.  Venous doppler neg WBC 14.4  Today she feels her leg is much better but her right foot is still swollen and tender. Current Outpatient Prescriptions on File Prior to Visit  Medication Sig Dispense Refill  . calcipotriene (DOVONOX) 0.005 % ointment Apply topically 2 (two) times daily.   4  . escitalopram (LEXAPRO) 10 MG tablet Take 1 tablet (10 mg total) by mouth daily. 90 tablet 1  . hydrochlorothiazide (HYDRODIURIL) 25 MG tablet TAKE 1 TABLET (25 MG TOTAL) BY MOUTH DAILY. 90 tablet 1  . levothyroxine (SYNTHROID, LEVOTHROID) 50 MCG tablet Take 1,000 mcg by mouth daily.     Marland Kitchen PROAIR HFA 108 (90 Base) MCG/ACT inhaler INHALE 2 PUFFS INTO THE LUNGS EVERY 4-6 HOURS  0  . propranolol (INDERAL) 10 MG tablet TAKE 1 TABLET BY MOUTH AS NEEDED AS DIRECTED 30 tablet 2   No current facility-administered medications on file prior to visit.     Allergies  Allergen Reactions  . Oxycodone Itching and Rash    Other Reaction: Red all over  . Codeine     Past Medical History:  Diagnosis Date  . Allergic rhinitis due to pollen   . Aortic regurgitation 07/06/2011  . Asthma   . History of high blood pressure    without diag  . Hypothyroidism associated with surgical procedure     . Migraine   . Panic attacks 12/16/2012  . PCOS (polycystic ovarian syndrome) 06/26/2014  . Social anxiety disorder 12/16/2012    Past Surgical History:  Procedure Laterality Date  . BREAST ENHANCEMENT SURGERY  1999  . CESAREAN SECTION    . FOOT SURGERY  2001  . LEEP    . THYROIDECTOMY, PARTIAL  2009   [facility]    Family History  Problem Relation Age of Onset  . Breast cancer Mother 60    Social History   Social History  . Marital status: Married    Spouse name: N/A  . Number of children: 1  . Years of education: N/A   Occupational History  . Art Director    Social History Main Topics  . Smoking status: Never Smoker  . Smokeless tobacco: Never Used  . Alcohol use No  . Drug use: No  . Sexual activity: Not on file   Other Topics Concern  . Not on file   Social History Narrative  . No narrative on file   The PMH, PSH, Social History, Family History, Medications, and allergies have been reviewed in [facility], and have been updated if relevant.   Review of Systems  Constitutional: Negative.   Gastrointestinal: Negative.   Genitourinary: Negative.   Neurological: Negative.   All other systems reviewed and are negative.  Objective:    BP 130/88   Pulse 81   Temp 98.1 F (36.7 C)   Ht 5\' 7"  (1.702 m)   SpO2 97%    Physical Exam  Constitutional: She is oriented to person, place, and time. She appears well-developed and well-nourished. No distress.  HENT:  Head: Normocephalic and atraumatic.  Eyes: Conjunctivae are normal.  Cardiovascular: Normal rate.   Pulmonary/Chest: Effort normal.  Musculoskeletal:       Right foot: There is tenderness and swelling. There is normal range of motion, no bony tenderness, normal capillary refill, no crepitus, no deformity and no laceration.  Neurological: She is alert and oriented to person, place, and time. No cranial nerve deficit.  Skin: She is not diaphoretic.  Psychiatric: She has a normal mood and affect. Her  behavior is normal. Judgment and thought content normal.  Nursing note and vitals reviewed.         Assessment & Plan:   Cellulitis of right leg No Follow-up on file.

## 2016-08-19 NOTE — Assessment & Plan Note (Signed)
New- improving but still with some persistent pain and redness. Will start an additional course of abx- bactrim ds- 1 tab twice daily x 10 days. Call or return to clinic prn if these symptoms worsen or fail to improve as anticipated. The patient indicates understanding of these issues and agrees with the plan.

## 2016-08-19 NOTE — Patient Instructions (Signed)
Great to see you. Take bactrim ds - 1 tablet twice daily x 10 days.  Please keep us updated.

## 2016-09-05 ENCOUNTER — Encounter: Payer: Self-pay | Admitting: Internal Medicine

## 2016-09-05 ENCOUNTER — Ambulatory Visit (INDEPENDENT_AMBULATORY_CARE_PROVIDER_SITE_OTHER): Payer: BLUE CROSS/BLUE SHIELD | Admitting: Internal Medicine

## 2016-09-05 VITALS — BP 130/80 | HR 109 | Temp 98.1°F | Ht 67.0 in

## 2016-09-05 DIAGNOSIS — R300 Dysuria: Secondary | ICD-10-CM | POA: Diagnosis not present

## 2016-09-05 DIAGNOSIS — R319 Hematuria, unspecified: Secondary | ICD-10-CM | POA: Diagnosis not present

## 2016-09-05 DIAGNOSIS — N3001 Acute cystitis with hematuria: Secondary | ICD-10-CM | POA: Diagnosis not present

## 2016-09-05 DIAGNOSIS — R3915 Urgency of urination: Secondary | ICD-10-CM

## 2016-09-05 LAB — POCT URINALYSIS DIPSTICK
BILIRUBIN UA: 3
Blood, UA: 3
GLUCOSE UA: 100
KETONES UA: 50
Protein, UA: 2
Urobilinogen, UA: >=8 E.U./dL — AB
pH, UA: 5 (ref 5.0–8.0)

## 2016-09-05 MED ORDER — SULFAMETHOXAZOLE-TRIMETHOPRIM 800-160 MG PO TABS: 1.0000 | ORAL_TABLET | Freq: Two times a day (BID) | ORAL | 1 refills | Status: DC

## 2016-09-05 NOTE — Assessment & Plan Note (Signed)
Classic history Will give bactrim for 3 days Continue symptomatic Rx

## 2016-09-05 NOTE — Progress Notes (Signed)
   Subjective:    Patient ID: Sharon MiniumChristine Kerr, female    DOB: 06/09/1975, 41 y.o.   MRN: [id number]  HPI Here due to urinary symptoms With husband and son  Started with urinary symptoms since yesterday Urgency--and little comes out Dull low back pain Flecks of blood Now with dysuria No fever  Took azo and ibuprofen 800mg --helped  Current Outpatient Prescriptions on File Prior to Visit  Medication Sig Dispense Refill  . calcipotriene (DOVONOX) 0.005 % ointment Apply topically 2 (two) times daily.   4  . escitalopram (LEXAPRO) 10 MG tablet Take 1 tablet (10 mg total) by mouth daily. 90 tablet 1  . hydrochlorothiazide (HYDRODIURIL) 25 MG tablet TAKE 1 TABLET (25 MG TOTAL) BY MOUTH DAILY. 90 tablet 1  . levothyroxine (SYNTHROID, LEVOTHROID) 50 MCG tablet Take 1,000 mcg by mouth daily.     Marland Kitchen. PROAIR HFA 108 (90 Base) MCG/ACT inhaler INHALE 2 PUFFS INTO THE LUNGS EVERY 4-6 HOURS  0  . propranolol (INDERAL) 10 MG tablet TAKE 1 TABLET BY MOUTH AS NEEDED AS DIRECTED 30 tablet 2   No current facility-administered medications on file prior to visit.     Allergies  Allergen Reactions  . Oxycodone Itching and Rash    Other Reaction: Red all over  . Codeine     Past Medical History:  Diagnosis Date  . Allergic rhinitis due to pollen   . Aortic regurgitation 07/06/2011  . Asthma   . History of high blood pressure    without diag  . Hypothyroidism associated with surgical procedure   . Migraine   . Panic attacks 12/16/2012  . PCOS (polycystic ovarian syndrome) 06/26/2014  . Social anxiety disorder 12/16/2012    Past Surgical History:  Procedure Laterality Date  . BREAST ENHANCEMENT SURGERY  1999  . CESAREAN SECTION    . FOOT SURGERY  2001  . LEEP    . THYROIDECTOMY, PARTIAL  2009   [facility]    Family History  Problem Relation Age of Onset  . Breast cancer Mother 3345    Social History   Social History  . Marital status: Married    Spouse name: N/A  . Number of children: 1    . Years of education: N/A   Occupational History  . Art Director    Social History Main Topics  . Smoking status: Never Smoker  . Smokeless tobacco: Never Used  . Alcohol use No  . Drug use: No  . Sexual activity: Not on file   Other Topics Concern  . Not on file   Social History Narrative  . No narrative on file    Review of Systems Only had 1 bladder infection---2001 No nausea or vomiting Appetite is off    Objective:   Physical Exam  Constitutional: She appears well-nourished. No distress.  Abdominal:  Mild suprapubic tenderness  Musculoskeletal:  No CVA tenderness Mild low back tenderness          Assessment & Plan:

## 2016-09-07 ENCOUNTER — Telehealth: Payer: Self-pay

## 2016-09-07 NOTE — Telephone Encounter (Signed)
Per chart review tab pt was seen 09/05/16 at Sat clinic.FYI to Dr Patsy [place]ager[place].

## 2016-09-07 NOTE — Telephone Encounter (Signed)
PLEASE NOTE: All timestamps contained within this report are represented as [place] Standard Time. CONFIDENTIALTY NOTICE: This fax transmission is intended only for the addressee. It contains information that is legally privileged, confidential or otherwise protected from use or disclosure. If you are not the intended recipient, you are strictly prohibited from reviewing, disclosing, copying using or disseminating any of this information or taking any action in reliance on or regarding this information. If you have received this fax in error, please notify us immediately by telephone so that we can arrange for its return to us. Phone: [phone], Toll-Free: [phone], Fax: [phone] Page: 1 of 1 Call Id: [id number] [facility] - Client TELEPHONE ADVICE RECORD [facility] Patient Name: Sharon MiniumCHRISTINE Cowger Gender: Female DOB: 04/01/1975 Age: [age] Y 4 M Return Phone Number: [phone] (Primary) [place]Zip: [place] [address] Client [facility] Client [facility] - Night Physician Hannah Beatopland, Spencer - MD Who Is Calling Patient / Member / Family / Caregiver Call Type Triage / Clinical Relationship To Patient Self Return Phone Number [phone] (Primary) Chief Complaint Urination Pain Reason for Call Symptomatic / Request for Health Information Initial Comment Caller states she has a uti. Nurse Assessment Nurse: Renaldo FiddlerAdkins, RN, Sharon FanningJulie Date/Time Lamount Cohen(Eastern Time): 09/05/2016 10:17:58 AM Confirm and document reason for call. If symptomatic, describe symptoms. ---Caller states she has sx of a UTI. She has burning, frequency and can see small specks of blood. She purchased the OTC test and it was positive for bacteria. Does the PT have any chronic conditions? (i.e. diabetes, asthma, etc.) ---Yes List chronic conditions. ---Hypothyroid, Htn Is the patient pregnant or possibly pregnant?  (Ask all females between the ages of 7412-55) ---No Guidelines Guideline Title Affirmed Question Urination Pain - Female Side (flank) or lower back pain present Disp. Time Lamount Cohen(Eastern Time) Disposition Final User 09/05/2016 10:30:01 AM See Physician within 4 Hours (or PCP triage) Yes Renaldo FiddlerAdkins, RN, Sharon FanningJulie Referrals [facility] Care Advice Given Per Guideline SEE PHYSICIAN WITHIN 4 HOURS (or PCP triage): * IF OFFICE WILL BE OPEN: You need to be seen within the next 3 or 4 hours. Call your doctor's office now or as soon as it opens. * For pain relief, take acetaminophen, ibuprofen, or naproxen. IBUPROFEN (E.G., MOTRIN, ADVIL): * You become worse. CARE ADVICE given per Urination Pain - Female (Adult) guideline.

## 2016-09-14 ENCOUNTER — Telehealth: Payer: Self-pay | Admitting: Family Medicine

## 2016-09-14 ENCOUNTER — Other Ambulatory Visit: Payer: Self-pay | Admitting: *Deleted

## 2016-09-14 DIAGNOSIS — N390 Urinary tract infection, site not specified: Secondary | ICD-10-CM

## 2016-09-14 MED ORDER — CIPROFLOXACIN HCL 250 MG PO TABS: 250.0000 mg | ORAL_TABLET | Freq: Two times a day (BID) | ORAL | 0 refills | Status: DC

## 2016-09-14 NOTE — Telephone Encounter (Signed)
Patient Name: Sharon MiniumCHRISTINE Kerr Gender: Female DOB: 01/03/1976 Age: [age] Y 4 M 7 D Return Phone Number: [phone] (Primary) City[place]Zip: [place] [address] Client [facility] - Client Client [facility] - Night Physician Copland, Karleen HampshireSpencer - MD Who Is Calling Patient / Member / Family / Caregiver Call Type Triage / Clinical Relationship To Patient Self Return Phone Number [phone] (Primary) Chief Complaint Urination Pain Reason for Call Symptomatic / Request for Health Information Initial Comment Pt has UTi on meds but not working Nurse Assessment Nurse: Allyne GeeSanders, RN, Samara DeistKathryn Date/Time (Eastern Time): 09/12/2016 11:09:40 AM Confirm and document reason for call. If symptomatic, describe symptoms. ---Caller states has uti and been on antibiotics. Bactrim prescribed last Saturday. Pt took Bactrim for 6 days and when she stopped after the 6th day, all symptoms have returned. Previously had infection in foot week 4th of July. On strong antibiotics then and wonders if that is why Bactrim didn't work. Pt now has urgency, burning, and blood in urine. Pt also has some flank pain. Does the PT have any chronic conditions? (i.e. diabetes, asthma, etc.) ---Yes List chronic conditions. ---Hypothyroid Is the patient pregnant or possibly pregnant? (Ask all females between the ages of 4112-55) ---No Guidelines Guideline Title Affirmed Question Urination Pain - Female Side (flank) or lower back pain present Disp. Time Lamount Cohen(Eastern Time) Disposition Final User 09/12/2016 11:33:13 AM See Physician within 4 Hours (or PCP triage) Yes Allyne GeeSanders, RN, Samara DeistKathryn Referrals [facility] Care Advice Given Per Guideline SEE PHYSICIAN WITHIN 4 HOURS (or PCP triage): CALL BACK IF: CARE ADVICE given per Urination Pain - Female (Adult) guideline. * IF OFFICE WILL BE OPEN: You need to be seen within the next 3 or 4 hours. Call your  doctor's office now or as soon as it opens. * You become worse. Comments Called pt back no appts available, pt will be going to urgent care.

## 2016-09-14 NOTE — Telephone Encounter (Signed)
Called to inform that Cipro 250 mg, 1 po bid, #14 has been sent to Pharmacy

## 2016-09-14 NOTE — Telephone Encounter (Signed)
Likely drug resistant UTI  There is no urine culture for susceptibilities.   Please send in   Cipro 250 mg, 1 po bid, #14

## 2016-11-30 ENCOUNTER — Other Ambulatory Visit: Payer: Self-pay | Admitting: Family Medicine

## 2017-03-03 ENCOUNTER — Other Ambulatory Visit: Payer: Self-pay

## 2017-03-03 ENCOUNTER — Encounter: Payer: Self-pay | Admitting: Family Medicine

## 2017-03-03 ENCOUNTER — Ambulatory Visit: Payer: BLUE CROSS/BLUE SHIELD | Admitting: Family Medicine

## 2017-03-03 VITALS — BP 140/100 | HR 99 | Temp 98.5°F | Ht 67.0 in

## 2017-03-03 DIAGNOSIS — R059 Cough, unspecified: Secondary | ICD-10-CM

## 2017-03-03 DIAGNOSIS — R05 Cough: Secondary | ICD-10-CM

## 2017-03-03 DIAGNOSIS — R6889 Other general symptoms and signs: Secondary | ICD-10-CM | POA: Diagnosis not present

## 2017-03-03 LAB — POC INFLUENZA A&B (BINAX/QUICKVUE)
INFLUENZA B, POC: NEGATIVE
Influenza A, POC: NEGATIVE

## 2017-03-03 MED ORDER — PROAIR HFA 108 (90 BASE) MCG/ACT IN AERS: INHALATION_SPRAY | RESPIRATORY_TRACT | 2 refills | Status: DC

## 2017-03-03 MED ORDER — CICLOPIROX 8 % EX SOLN: CUTANEOUS | 2 refills | Status: DC

## 2017-03-03 NOTE — Progress Notes (Signed)
Dr. Karleen Hampshire T. Makenna Macaluso, MD, CAQ [facility] [address] [place], [address] Phone: [phone] Fax: [phone]  03/03/2017  Patient: Sharon Kerr, MRN: [id number], DOB: 06/07/1975, 42 y.o.  Primary Physician:  Hannah Beat, MD   Chief Complaint  Patient presents with  . Cough  . Wheezing  . Watery eyes  . Shortness of Breath  . Fever    Low grade yesterday   Subjective:   Sharon Kerr presents with runny nose, sneezing, cough, sore throat, malaise, myalgias, arthralgia, chills, and fever.  Sneezing, congestion, cough and feeling short of breath. Not good on Sat - about 5 days.  101. Achy, tired, having some joint aches and muscle aches.   + recent exposure to others with similar symptoms.   The patent denies sore throat as the primary complaint. Denies sthortness of breath/wheezing, otalgia, facial pain, abdominal pain, changes in bowel or bladder.  Generally feels terrible  PMH, PHS, Allergies, Problem List, Medications, Family History, and Social History have all been reviewed.  Patient Active Problem List   Diagnosis Date Noted  . Acute cystitis with hematuria 09/05/2016  . Cellulitis of right leg 08/19/2016  . Essential hypertension, benign 02/07/2016  . PCOS (polycystic ovarian syndrome) 06/26/2014  . Hypothyroidism associated with surgical procedure   . Social anxiety disorder 12/16/2012  . Panic attacks 12/16/2012  . Allergic rhinitis due to pollen   . Thyroid nodule 07/08/2011  . Aortic regurgitation 07/06/2011  . ACUTE GOUTY ARTHROPATHY 10/25/2009  . ASTHMA 11/23/2007    Past Medical History:  Diagnosis Date  . Allergic rhinitis due to pollen   . Aortic regurgitation 07/06/2011  . Asthma   . History of high blood pressure    without diag  . Hypothyroidism associated with surgical procedure   . Migraine   . Panic attacks 12/16/2012  . PCOS (polycystic ovarian syndrome) 06/26/2014  . Social  anxiety disorder 12/16/2012    Past Surgical History:  Procedure Laterality Date  . BREAST ENHANCEMENT SURGERY  1999  . CESAREAN SECTION    . FOOT SURGERY  2001  . LEEP    . THYROIDECTOMY, PARTIAL  2009   [facility]    Social History   Socioeconomic History  . Marital status: Married    Spouse name: Not on file  . Number of children: 1  . Years of education: Not on file  . Highest education level: Not on file  Social Needs  . Financial resource strain: Not on file  . Food insecurity - worry: Not on file  . Food insecurity - inability: Not on file  . Transportation needs - medical: Not on file  . Transportation needs - non-medical: Not on file  Occupational History  . Occupation: Biomedical scientist  Tobacco Use  . Smoking status: Never Smoker  . Smokeless tobacco: Never Used  Substance and Sexual Activity  . Alcohol use: No    Alcohol/week: 0.0 oz  . Drug use: No  . Sexual activity: Not on file  Other Topics Concern  . Not on file  Social History Narrative  . Not on file    Family History  Problem Relation Age of Onset  . Breast cancer Mother 57    Allergies  Allergen Reactions  . Oxycodone Itching and Rash    Other Reaction: Red all over  . Codeine     Medication list reviewed and updated in full in [facility].  ROS as above, eating and drinking -  tolerating PO. Urinating normally. No excessive vomitting or diarrhea. O/w as above.  Objective:   Blood pressure (!) 140/100, pulse 99, temperature 98.5 F (36.9 C), temperature source Oral, height 5\' 7"  (1.702 m), SpO2 98 %.  Gen: WDWN, NAD; A & O x3, cooperative. Pleasant.Globally Non-toxic HEENT: Normocephalic and atraumatic. Throat clear, w/o exudate, R TM clear, L TM - good landmarks, No fluid present. rhinnorhea. No frontal or maxillary sinus T. MMM NECK: Anterior cervical  LAD is absent CV: RRR, No M/G/R, cap refill <2 sec PULM: Breathing comfortably in no respiratory distress. no wheezing, crackles,  rhonchi ABD: S,NT,ND,+BS. No HSM. No rebound. EXT: No c/c/e PSYCH: Friendly, good eye contact MSK: Nml gait  Results for orders placed or performed in visit on 03/03/17  POC Influenza A&B (Binax test)  Result Value Ref Range   Influenza A, POC Negative Negative   Influenza B, POC Negative Negative    Assessment and Plan:   Flu-like symptoms  Cough - Plan: POC Influenza A&B (Binax test)  Continue with supportive care, push fluids, pro-air as needed.  For the patient's onychomycosis, she can also use Penlac, and reviewed its use.  Follow-up: No Follow-up on file.  Meds ordered this encounter  Medications  . ciclopirox (PENLAC) 8 % solution    Sig: Apply over nail and surrounding skin. Apply qhs over prior. After 7 days, may remove with alcohol and repeat    Dispense:  6.6 mL    Refill:  2  . PROAIR HFA 108 (90 Base) MCG/ACT inhaler    Sig: INHALE 2 PUFFS INTO THE LUNGS EVERY 4-6 HOURS    Dispense:  1 Inhaler    Refill:  2   Orders Placed This Encounter  Procedures  . POC Influenza A&B (Binax test)    Signed,  Araceli Coufal T. Lola Lofaro, MD   Allergies as of 03/03/2017      Reactions   Oxycodone Itching, Rash   Other Reaction: Red all over   Codeine       Medication List        Accurate as of 03/03/17 11:59 PM. Always use your most recent med list.          calcipotriene 0.005 % ointment Commonly known as:  DOVONOX Apply topically 2 (two) times daily.   ciclopirox 8 % solution Commonly known as:  PENLAC Apply over nail and surrounding skin. Apply qhs over prior. After 7 days, may remove with alcohol and repeat   escitalopram 10 MG tablet Commonly known as:  LEXAPRO Take 1 tablet (10 mg total) by mouth daily.   hydrochlorothiazide 25 MG tablet Commonly known as:  HYDRODIURIL TAKE 1 TABLET (25 MG TOTAL) BY MOUTH DAILY.   levothyroxine 50 MCG tablet Commonly known as:  SYNTHROID, LEVOTHROID Take 1,000 mcg by mouth daily.   PROAIR HFA 108 (90 Base)  MCG/ACT inhaler Generic drug:  albuterol INHALE 2 PUFFS INTO THE LUNGS EVERY 4-6 HOURS   propranolol 10 MG tablet Commonly known as:  INDERAL TAKE 1 TABLET BY MOUTH AS NEEDED AS DIRECTED

## 2017-03-09 ENCOUNTER — Encounter: Payer: Self-pay | Admitting: *Deleted

## 2017-03-09 ENCOUNTER — Other Ambulatory Visit: Payer: Self-pay | Admitting: Family Medicine

## 2017-05-14 ENCOUNTER — Other Ambulatory Visit: Payer: Self-pay | Admitting: Family Medicine

## 2017-07-09 ENCOUNTER — Other Ambulatory Visit: Payer: BLUE CROSS/BLUE SHIELD

## 2017-07-14 ENCOUNTER — Encounter: Payer: BLUE CROSS/BLUE SHIELD | Admitting: Family Medicine

## 2018-01-25 ENCOUNTER — Encounter: Payer: Self-pay | Admitting: Family Medicine

## 2018-01-25 ENCOUNTER — Ambulatory Visit: Payer: BLUE CROSS/BLUE SHIELD | Admitting: Family Medicine

## 2018-01-25 VITALS — BP 128/90 | HR 105 | Temp 98.8°F | Resp 14 | Ht 67.0 in

## 2018-01-25 DIAGNOSIS — J4520 Mild intermittent asthma, uncomplicated: Secondary | ICD-10-CM | POA: Diagnosis not present

## 2018-01-25 DIAGNOSIS — R69 Illness, unspecified: Secondary | ICD-10-CM

## 2018-01-25 DIAGNOSIS — J111 Influenza due to unidentified influenza virus with other respiratory manifestations: Secondary | ICD-10-CM

## 2018-01-25 MED ORDER — PROAIR HFA 108 (90 BASE) MCG/ACT IN AERS: INHALATION_SPRAY | RESPIRATORY_TRACT | 2 refills | Status: DC

## 2018-01-25 MED ORDER — BENZONATATE 100 MG PO CAPS: 100.0000 mg | ORAL_CAPSULE | Freq: Three times a day (TID) | ORAL | 0 refills | Status: DC | PRN

## 2018-01-25 NOTE — Patient Instructions (Addendum)
Based on your symptoms, it looks like you have a virus.   Antibiotics are not need for a viral infection but the following will help:   1. Drink plenty of fluids 2. Get lots of rest  Sinus Congestion 1) Neti Pot (Saline rinse) -- 2 times day -- if tolerated 2) Flonase (Store Brand ok) - once daily 3) Over the counter congestion medications  Cough 1) Cough drops can be helpful 2) Nyquil (or nighttime cough medication) 3) Honey is proven to be one of the best cough medications  4) Tessalon Pearls cough medication  Sore Throat 1) Honey as above, cough drops 2) Ibuprofen or Aleve can be helpful 3) Salt water Gargles  If you develop fevers (Temperature >100.4), chills, worsening symptoms or symptoms lasting longer than 10 days return to clinic.

## 2018-01-25 NOTE — Progress Notes (Signed)
Subjective:     Peter MiniumChristine Ashmead is a 42 y.o. female presenting for Cough (symptoms started on 01/20/18. has been taking Amox 500 mg 1 twice daily that she had at home. Has had chills and sweats off and on. Lungs hurt, pain in the back and side from coughing. Had asthma attack last night and used her inhaler. Headaches.)     Cough  This is a new problem. The current episode started in the past 7 days. The problem has been unchanged. The problem occurs every few minutes. The cough is non-productive. Associated symptoms include chills, a fever, headaches, myalgias, nasal congestion, postnasal drip, rhinorrhea and wheezing. Pertinent negatives include no ear pain or sore throat. The symptoms are aggravated by lying down. She has tried a beta-agonist inhaler (antibiotics, nyquil, ibuprofen) for the symptoms. The treatment provided mild relief. Her past medical history is significant for asthma and environmental allergies.   Amoxicillin x 5 days w/o improvement  Symptoms start immediately  Around someone the night before who was sick  Review of Systems  Constitutional: Positive for chills and fever.  HENT: Positive for congestion, postnasal drip, rhinorrhea, sinus pressure and sinus pain. Negative for ear discharge, ear pain and sore throat.   Respiratory: Positive for cough and wheezing.   Gastrointestinal: Positive for vomiting. Negative for abdominal pain, diarrhea and nausea.  Musculoskeletal: Positive for myalgias.  Allergic/Immunologic: Positive for environmental allergies.  Neurological: Positive for headaches.     Social History   Tobacco Use  Smoking Status Never Smoker  Smokeless Tobacco Never Used        Objective:    BP Readings from Last 3 Encounters:  01/25/18 128/90  03/03/17 (!) 140/100  09/05/16 130/80   Wt Readings from Last 3 Encounters:  01/16/11 215 lb (97.5 kg)    BP 128/90   Pulse (!) 105   Temp 98.8 F (37.1 C)   Resp 14   Ht 5\' 7"  (1.702 m)    LMP 01/25/2017   SpO2 97%   BMI 33.67 kg/m    Physical Exam Constitutional:      General: She is not in acute distress.    Appearance: She is well-developed. She is not diaphoretic.  HENT:     Head: Normocephalic and atraumatic.     Right Ear: Tympanic membrane and ear canal normal.     Left Ear: Tympanic membrane and ear canal normal.     Nose: Mucosal edema and rhinorrhea present.     Right Sinus: No maxillary sinus tenderness or frontal sinus tenderness.     Left Sinus: No maxillary sinus tenderness or frontal sinus tenderness.     Mouth/Throat:     Pharynx: Uvula midline. Posterior oropharyngeal erythema present. No oropharyngeal exudate.     Tonsils: Swelling: 0 on the right. 0 on the left.  Eyes:     General: No scleral icterus.    Conjunctiva/sclera: Conjunctivae normal.  Neck:     Musculoskeletal: Neck supple.  Cardiovascular:     Rate and Rhythm: Normal rate and regular rhythm.     Heart sounds: Normal heart sounds. No murmur.  Pulmonary:     Effort: Pulmonary effort is normal. No respiratory distress.     Breath sounds: Wheezing (scattered, faint) present.  Lymphadenopathy:     Cervical: No cervical adenopathy.  Skin:    General: Skin is warm and dry.     Capillary Refill: Capillary refill takes less than 2 seconds.  Neurological:     General: No  focal deficit present.     Mental Status: She is alert. Mental status is at baseline.  Psychiatric:        Mood and Affect: Mood normal.           Assessment & Plan:   Problem List Items Addressed This Visit      Respiratory   Asthma   Relevant Medications   PROAIR HFA 108 (90 Base) MCG/ACT inhaler    Other Visit Diagnoses    Influenza-like illness    -  Primary   Relevant Medications   benzonatate (TESSALON) 100 MG capsule     Day 5 of illness. Suspect it is the flu given rapid onset and severity. Too far out for treatment.   Symptomatic care  If anyone around her develops symptoms advised quick  evaluation Deferred testing as would not change management  Return if symptoms worsen or fail to improve.  Lynnda ChildJessica R Ellionna Buckbee, MD

## 2018-01-31 LAB — HM PAP SMEAR: HM Pap smear: NEGATIVE

## 2018-02-03 LAB — HM MAMMOGRAPHY

## 2018-05-12 ENCOUNTER — Other Ambulatory Visit: Payer: Self-pay | Admitting: *Deleted

## 2018-05-12 MED ORDER — CICLOPIROX 8 % EX SOLN: CUTANEOUS | 2 refills | Status: DC

## 2018-05-12 NOTE — Telephone Encounter (Signed)
Last office visit 01/25/2018 with Dr. Selena Batten for Influenza like illness.   Last refilled 03/03/2017 for 6.6 ml with 2 refills.   CPE scheduled for 10/13/2018.  Ok to refill?

## 2018-05-26 ENCOUNTER — Other Ambulatory Visit: Payer: BLUE CROSS/BLUE SHIELD

## 2018-06-01 ENCOUNTER — Encounter: Payer: BLUE CROSS/BLUE SHIELD | Admitting: Family Medicine

## 2018-07-13 ENCOUNTER — Telehealth: Payer: Self-pay | Admitting: Family Medicine

## 2018-07-13 DIAGNOSIS — E669 Obesity, unspecified: Secondary | ICD-10-CM

## 2018-07-13 DIAGNOSIS — R7303 Prediabetes: Secondary | ICD-10-CM

## 2018-07-13 NOTE — Telephone Encounter (Signed)
Patient called to request a referral be sent to Encarnacion Chu at [facility] nutrition and diabetic education center. Asked patient if she has discuss the referral and requesting to be seen by a educator with Dr Lorelei Pont, she said no but that her weight and eating healthier has been discussed and feels this is the best way to go   Does this patient need to schedule a visit to discuss this further?    PHONE- [phone]

## 2018-07-13 NOTE — Telephone Encounter (Signed)
Dahlia notified as instructed by telephone.

## 2018-07-13 NOTE — Telephone Encounter (Signed)
I put the consult in.  It is very difficulty to get insurance approval without diabetes.  I used the prediabetes code to see if that will work.

## 2018-07-19 ENCOUNTER — Other Ambulatory Visit: Payer: Self-pay

## 2018-07-19 ENCOUNTER — Encounter: Payer: Self-pay | Admitting: Dietician

## 2018-07-19 ENCOUNTER — Encounter: Payer: BC Managed Care – PPO | Attending: Family Medicine | Admitting: Dietician

## 2018-07-19 VITALS — Ht 67.0 in | Wt 330.6 lb

## 2018-07-19 DIAGNOSIS — Z6841 Body Mass Index (BMI) 40.0 and over, adult: Secondary | ICD-10-CM

## 2018-07-19 DIAGNOSIS — R7303 Prediabetes: Secondary | ICD-10-CM

## 2018-07-19 NOTE — Progress Notes (Signed)
Medical Nutrition Therapy:  Visit start time: 1400  end time: 1510 Assessment:  Diagnosis: prediabetes, obesity Past medical history: hypertension, PCOS Psychosocial issues/ stress concerns:  Patient rates her stress as high; in process of dissolving a business partnership; scored 17 on PHQ depression scale but states she does not want to harm herself. She copes by eating. Recommended she contact her PCP regarding medication she previously took (Lexapro) which helped.  Preferred learning method:  . Auditory . Visual . Hands-on  Current weight: 330.6 lbs Height: 67 in BMI: 51.78 Medications, supplements: see list  Progress and evaluation:  Patient in for initial medical nutrition therapy appointment. She initiated the appointment and states she feels ready to make positive diet and exercise changes. She reports having weight issues since childhood and she states her mother was constantly wanting her to lose weight. She remembers taking Dexatrim weight loss pills as a child. She weighed 140 lbs in HS and weighed 200 lbs by age 43. She has tried several "diets" with only short term success.  She has a documented gain of 115 lbs in the past 7 years. 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 She reports she typically skips breakfast and all lunch and dinner meals are "take out" or now with Covid restrictions lifting, "dine in". As a result, her diet is high in calories, fat, sodium and low in fruits, vegetables and whole grains.    Physical activity: She purchased a bicycle and rides the bike or walks 1-2 days per week for 30 minutes Weight management/prediabetes: Instructed on a meal plan including carbohydrate counting and how to better balance carbohydrate, protein and fat as well as portion control. Discussed frequency of dining out and difficulty of controlling calories, carbohydrate, fat and sodium with 14  meals/week "out". Discussed options of meal prep at home beginning with the lunch meal. Gave and reviewed "Quick and Healthy Meals". Also, discussed at length ways to cut calories when dining out while still choosing foods she likes. Encouraged to use meal plan as a guide to begin process of becoming mindful of food choices and portions. Discussed role of exercise both for weight control and diabetes prevention.   Nutritional Diagnosis:  NI-1.5 Excessive energy intake As related to all lunch and dinner meals "take out" or "dine out" with large portions and high fat choices.  As evidenced by diet history and weight gain of 115 lbs in 7 years..  Intervention:  Balance meals with protein, 2-4 servings of carbohydrate and non-starchy vegetables.  Spread 13-14 servings of carbohydrate over 3 meals and 1-2 snacks. Prepare some foods for lunches ahead of time to decrease meals eaten "out". At each meal, be mindful of ways to decrease fat (margarine/butter, mayonnaise, salad dressings, etc) Use the fork method for salad dressings. Include a snack between lunch and dinner: cottage cheese/fruit or yogurt or fruit/cheese  Move dinner up to 7:00pm instead of 8-9:00pm Exercise goal: 3 times per week for 30 minutes- bike riding  Education Materials given:  . Plate Planner . Food lists/ Planning A Balanced Meal . Quick and Healthy Meals . Sample meal pattern/ menus . Goals/ instructions Learner/ who was taught:  . Patient  Level of understanding: . Partial understanding; needs review/ practice  Demonstrated degree of understanding via:   Teach back Learning barriers: . None  Willingness to learn/ readiness for change:  Eager Monitoring and Evaluation:  Dietary intake,  exercise,  and body weight      follow up: 08/15/18 at 3:30pm

## 2018-07-19 NOTE — Patient Instructions (Signed)
Balance meals with protein, 2-4 servings of carbohydrate and non-starchy vegetables.  Spread 13-14 servings of carbohydrate over 3 meals and 1-2 snacks. Prepare some foods for lunches ahead of time to decrease meals eaten "out". At each meal, be mindful of ways to decrease fat (margarine/butter, mayonnaise, salad dressings, etc) Use the fork method for salad dressings. Include a snack between lunch and dinner: cottage cheese/fruit or yogurt or fruit/cheese  Move dinner up to 7:00pm instead of 8-9:00pm Exercise goal: 3 times per week for 30 minutes- bike riding

## 2018-08-15 ENCOUNTER — Ambulatory Visit: Payer: BC Managed Care – PPO | Admitting: Dietician

## 2018-09-01 ENCOUNTER — Encounter: Payer: Self-pay | Admitting: Dietician

## 2018-10-04 ENCOUNTER — Telehealth: Payer: Self-pay

## 2018-10-04 NOTE — Telephone Encounter (Signed)
Opened in error

## 2018-10-04 NOTE — Telephone Encounter (Signed)
Left detailed VM w COVID screen and back door lab info   

## 2018-10-05 ENCOUNTER — Encounter: Payer: Self-pay | Admitting: Family Medicine

## 2018-10-05 ENCOUNTER — Ambulatory Visit (INDEPENDENT_AMBULATORY_CARE_PROVIDER_SITE_OTHER): Payer: BC Managed Care – PPO | Admitting: Family Medicine

## 2018-10-05 ENCOUNTER — Other Ambulatory Visit: Payer: Self-pay

## 2018-10-05 VITALS — BP 142/100 | HR 97 | Temp 98.4°F

## 2018-10-05 DIAGNOSIS — L409 Psoriasis, unspecified: Secondary | ICD-10-CM | POA: Diagnosis not present

## 2018-10-05 DIAGNOSIS — L03116 Cellulitis of left lower limb: Secondary | ICD-10-CM

## 2018-10-05 DIAGNOSIS — I10 Essential (primary) hypertension: Secondary | ICD-10-CM

## 2018-10-05 MED ORDER — DOXYCYCLINE HYCLATE 100 MG PO CAPS: 100.0000 mg | ORAL_CAPSULE | Freq: Two times a day (BID) | ORAL | 0 refills | Status: AC

## 2018-10-05 MED ORDER — HYDROCHLOROTHIAZIDE 25 MG PO TABS: 25.0000 mg | ORAL_TABLET | Freq: Every day | ORAL | 1 refills | Status: DC

## 2018-10-05 NOTE — Patient Instructions (Signed)
Good to see you today  Keep your leg elevated as much as possible, apply ice if needed  Start HCTZ for your blood pressure, check 2 times a week, follow up in 4 weeks for labs and blood pressure check

## 2018-10-05 NOTE — Progress Notes (Signed)
Subjective:    Patient ID: Sharon Kerr, female    DOB: October 30, 1975, 43 y.o.   MRN: [id number]  HPI Chief Complaint  Patient presents with  . Cellulitis    Pt feels her left foot is infected.  . Psoriasis    Pt requesting a Rx for her Psoriasis, flaring more often. Does not have a Dermatologist at this time.  . Hypertension    Elevated BP over the last several years. No treatment.  Constant headaches. Denies blurred or distorted vision.   This is a 43 yo female who presents today to discuss the above concerns. She has a history of psoriasis on her feet with a previous case of cellulitis that spread quickly and resulted in hospital stay for IV antibiotics. She has seen a dermatologist previously, at least 1.5 years ago and was told there was not much to offer her at that time. She has had increased flares which she attributes to stress.   Was previously on HCTZ for elevated blood pressure, stopped taking and has not had regular care aside from gyn visit. Gyn manages levothyroxine.   Past Medical History:  Diagnosis Date  . Allergic rhinitis due to pollen   . Aortic regurgitation 07/06/2011  . Asthma   . History of high blood pressure    without diag  . Hypothyroidism associated with surgical procedure   . Migraine   . Panic attacks 12/16/2012  . PCOS (polycystic ovarian syndrome) 06/26/2014  . Social anxiety disorder 12/16/2012   Past Surgical History:  Procedure Laterality Date  . BREAST ENHANCEMENT SURGERY  1999  . CESAREAN SECTION    . FOOT SURGERY  2001  . LEEP    . THYROIDECTOMY, PARTIAL  2009   [facility]   Family History  Problem Relation Age of Onset  . Breast cancer Mother 1   Social History   Tobacco Use  . Smoking status: Never Smoker  . Smokeless tobacco: Never Used  Substance Use Topics  . Alcohol use: No    Alcohol/week: 0.0 standard drinks  . Drug use: No      Review of Systems Per HPI    Objective:   Physical Exam Vitals signs reviewed.   Constitutional:      General: She is not in acute distress.    Appearance: Normal appearance. She is obese. She is not ill-appearing, toxic-appearing or diaphoretic.  HENT:     Head: Normocephalic and atraumatic.  Eyes:     Conjunctiva/sclera: Conjunctivae normal.  Cardiovascular:     Rate and Rhythm: Normal rate.  Pulmonary:     Effort: Pulmonary effort is normal.  Skin:    General: Skin is warm and dry.     Comments: Left medial and top of foot with erythema and swelling, warm to touch (see inserted clinical image). Bilateral feet with dry, patchy, cracking skin.   Neurological:     Mental Status: She is alert and oriented to person, place, and time.  Psychiatric:        Mood and Affect: Mood normal.        Behavior: Behavior normal.        Thought Content: Thought content normal.        Judgment: Judgment normal.            BP (!) 142/100 (BP Location: Left Wrist, Patient Position: Sitting, Cuff Size: Normal)   Pulse 97   Temp 98.4 F (36.9 C) (Temporal)   SpO2 98%  Wt Readings from Last 3  Encounters:  07/19/18 (!) 330 lb 9.6 oz (150 kg)  01/16/11 215 lb (97.5 kg)   BP Readings from Last 3 Encounters:  10/05/18 (!) 142/100  01/25/18 128/90  03/03/17 (!) 140/100    Assessment & Plan:  1. Cellulitis of left foot - instructed to elevate as much as possible and use ice as needed, can take otc analgesics for pain - strict RTC/ER precautions reviewed with patient - doxycycline (VIBRAMYCIN) 100 MG capsule; Take 1 capsule (100 mg total) by mouth 2 (two) times daily for 10 days.  Dispense: 20 capsule; Refill: 0  2. Psoriasis - Ambulatory referral to Dermatology  3. Essential hypertension - follow up in 4 weeks for recheck and labs - hydrochlorothiazide (HYDRODIURIL) 25 MG tablet; Take 1 tablet (25 mg total) by mouth daily.  Dispense: 90 tablet; Refill: 1   Olean Reeeborah Gessner, FNP-BC  [facility]  10/06/2018 7:26  AM

## 2018-10-06 ENCOUNTER — Other Ambulatory Visit: Payer: Self-pay | Admitting: Family Medicine

## 2018-10-06 ENCOUNTER — Other Ambulatory Visit: Payer: BLUE CROSS/BLUE SHIELD

## 2018-10-06 DIAGNOSIS — E890 Postprocedural hypothyroidism: Secondary | ICD-10-CM

## 2018-10-06 DIAGNOSIS — Z0000 Encounter for general adult medical examination without abnormal findings: Secondary | ICD-10-CM

## 2018-10-06 DIAGNOSIS — R7303 Prediabetes: Secondary | ICD-10-CM

## 2018-10-13 ENCOUNTER — Encounter: Payer: BLUE CROSS/BLUE SHIELD | Admitting: Family Medicine

## 2018-11-09 ENCOUNTER — Ambulatory Visit: Payer: BC Managed Care – PPO | Admitting: Family Medicine

## 2018-11-21 ENCOUNTER — Ambulatory Visit (INDEPENDENT_AMBULATORY_CARE_PROVIDER_SITE_OTHER): Payer: BC Managed Care – PPO | Admitting: Family Medicine

## 2018-11-21 ENCOUNTER — Other Ambulatory Visit: Payer: Self-pay

## 2018-11-21 ENCOUNTER — Encounter: Payer: Self-pay | Admitting: Family Medicine

## 2018-11-21 VITALS — BP 118/80 | HR 103 | Temp 98.7°F

## 2018-11-21 DIAGNOSIS — E119 Type 2 diabetes mellitus without complications: Secondary | ICD-10-CM

## 2018-11-21 DIAGNOSIS — E890 Postprocedural hypothyroidism: Secondary | ICD-10-CM

## 2018-11-21 DIAGNOSIS — I10 Essential (primary) hypertension: Secondary | ICD-10-CM

## 2018-11-21 DIAGNOSIS — F439 Reaction to severe stress, unspecified: Secondary | ICD-10-CM

## 2018-11-21 DIAGNOSIS — L03116 Cellulitis of left lower limb: Secondary | ICD-10-CM

## 2018-11-21 DIAGNOSIS — R7303 Prediabetes: Secondary | ICD-10-CM

## 2018-11-21 LAB — CBC WITH DIFFERENTIAL/PLATELET
Basophils Absolute: 0.1 10*3/uL (ref 0.0–0.1)
Basophils Relative: 0.7 % (ref 0.0–3.0)
Eosinophils Absolute: 0.2 10*3/uL (ref 0.0–0.7)
Eosinophils Relative: 1.7 % (ref 0.0–5.0)
HCT: 43.8 % (ref 36.0–46.0)
Hemoglobin: 14.7 g/dL (ref 12.0–15.0)
Lymphocytes Relative: 24.1 % (ref 12.0–46.0)
Lymphs Abs: 2.5 10*3/uL (ref 0.7–4.0)
MCHC: 33.6 g/dL (ref 30.0–36.0)
MCV: 89.4 fl (ref 78.0–100.0)
Monocytes Absolute: 1 10*3/uL (ref 0.1–1.0)
Monocytes Relative: 9.1 % (ref 3.0–12.0)
Neutro Abs: 6.8 10*3/uL (ref 1.4–7.7)
Neutrophils Relative %: 64.4 % (ref 43.0–77.0)
Platelets: 236 10*3/uL (ref 150.0–400.0)
RBC: 4.9 Mil/uL (ref 3.87–5.11)
RDW: 14.1 % (ref 11.5–15.5)
WBC: 10.5 10*3/uL (ref 4.0–10.5)

## 2018-11-21 LAB — COMPREHENSIVE METABOLIC PANEL
ALT: 49 U/L — ABNORMAL HIGH (ref 0–35)
AST: 26 U/L (ref 0–37)
Albumin: 3.9 g/dL (ref 3.5–5.2)
Alkaline Phosphatase: 59 U/L (ref 39–117)
BUN: 10 mg/dL (ref 6–23)
CO2: 26 mEq/L (ref 19–32)
Calcium: 8.9 mg/dL (ref 8.4–10.5)
Chloride: 101 mEq/L (ref 96–112)
Creatinine, Ser: 0.72 mg/dL (ref 0.40–1.20)
GFR: 88.18 mL/min (ref >60.00)
Glucose, Bld: 167 mg/dL — ABNORMAL HIGH (ref 70–99)
Potassium: 3.6 mEq/L (ref 3.5–5.1)
Sodium: 138 mEq/L (ref 135–145)
Total Bilirubin: 1.2 mg/dL (ref 0.2–1.2)
Total Protein: 6.8 g/dL (ref 6.0–8.3)

## 2018-11-21 LAB — LIPID PANEL
Cholesterol: 180 mg/dL (ref 0–200)
HDL: 35.8 mg/dL — ABNORMAL LOW (ref >39.00)
LDL Cholesterol: 114 mg/dL — ABNORMAL HIGH (ref 0–99)
NonHDL: 144.21
Total CHOL/HDL Ratio: 5
Triglycerides: 150 mg/dL — ABNORMAL HIGH (ref 0.0–149.0)
VLDL: 30 mg/dL (ref 0.0–40.0)

## 2018-11-21 LAB — T4, FREE: Free T4: 0.87 ng/dL (ref 0.60–1.60)

## 2018-11-21 LAB — TSH: TSH: 2.57 u[IU]/mL (ref 0.35–4.50)

## 2018-11-21 LAB — HEMOGLOBIN A1C: Hgb A1c MFr Bld: 7.2 % — ABNORMAL HIGH (ref 4.6–6.5)

## 2018-11-21 MED ORDER — SULFAMETHOXAZOLE-TRIMETHOPRIM 800-160 MG PO TABS: 2.0000 | ORAL_TABLET | Freq: Two times a day (BID) | ORAL | 0 refills | Status: AC

## 2018-11-21 NOTE — Patient Instructions (Addendum)
Good to see you today  Please follow up with dermatology  Call about a counseling appointment  Follow up in 2 months  Try to increase sleep, water, get up every hour for a few minutes. When cellulitis resolved, wear an over the counter mild-medium compression sock  I will notify you of labs

## 2018-11-21 NOTE — Progress Notes (Signed)
Subjective:    Patient ID: Sharon Kerr, female    DOB: 1975/02/11, 43 y.o.   MRN: [id number]  HPI Chief Complaint  Patient presents with  . Cellulitis    x 1 day. Started aggresively and progressed quickly climbing left leg up calf. Denies fever today but states that she had terrible chills and sweats last night, none today. Leg is hot to the touch.    This is a 43 yo female who presents with the above cc. Was seen 10/05/2018 with cellulitis of same leg, given rx for doxycycline.  She had complete resolution of symptoms.  She noticed redness and warmth yesterday.  She had some Bactrim DS at home and took one last night and 1 this morning.  It has not spread since yesterday.  She did have some subjective fever and chills last night.  This resolved about 4 hours after taking antibiotic.  She has psoriasis on her feet and hands.  When I saw her last month, I put in a referral for her to see dermatology.  Unfortunately, she was unable to keep the appointment.  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  This has been very stressful for her and she feels that she is overwhelmed and unable to find balance with work.  She is only sleeping 3 to 5 hours at night.  She does not feel that her husband is supportive.  She does not have anyone else to talk with.  Morbid obesity-no recent weights, but she reports a significant weight gain over the last 7 years.  She has hypothyroidism that has been managed by her gynecologist.  She reports that she has annual blood work every April.  We will check labs today.  She reports that she feels worthless and her husband is always on her about her weight.  Essential hypertension-blood pressure was very elevated at last visit and she was started on hydrochlorothiazide 25 mg.  Blood pressure significantly improved today.  We  will check labs.  Past Medical History:  Diagnosis Date  . Allergic rhinitis due to pollen   . Aortic regurgitation 07/06/2011  . Asthma   . History of high blood pressure    without diag  . Hypothyroidism associated with surgical procedure   . Migraine   . Panic attacks 12/16/2012  . PCOS (polycystic ovarian syndrome) 06/26/2014  . Social anxiety disorder 12/16/2012   Past Surgical History:  Procedure Laterality Date  . BREAST ENHANCEMENT SURGERY  1999  . CESAREAN SECTION    . FOOT SURGERY  2001  . LEEP    . THYROIDECTOMY, PARTIAL  2009   [facility]   Family History  Problem Relation Age of Onset  . Breast cancer Mother 102   Social History   Tobacco Use  . Smoking status: Never Smoker  . Smokeless tobacco: Never Used  Substance Use Topics  . Alcohol use: No    Alcohol/week: 0.0 standard drinks  . Drug use: No       Review of Systems Per HPI    Objective:   Physical Exam Vitals signs reviewed.  Constitutional:      General: She is not in acute distress.    Appearance: Normal appearance. She is obese. She is not ill-appearing or toxic-appearing.  HENT:     Head: Normocephalic and atraumatic.  Eyes:  Conjunctiva/sclera: Conjunctivae normal.  Cardiovascular:     Rate and Rhythm: Tachycardia present.  Pulmonary:     Effort: Pulmonary effort is normal.  Skin:    General: Skin is warm and dry.     Comments: Left lower leg with moderate erythema, warmth, slightly raised, approximately 80% circumferentially.  No drainage.  Mild edema.  No pitting.  Does not extend into foot or above knee.  She does have large area of dry, cracked, plaque on her left medial foot.  Neurological:     Mental Status: She is alert and oriented to person, place, and time.  Psychiatric:        Mood and Affect: Mood normal.        Behavior: Behavior normal.        Thought Content: Thought content normal.        Judgment: Judgment normal.      BP 118/80 (BP Location: Left Wrist,  Patient Position: Sitting, Cuff Size: Normal)   Pulse (!) 103   Temp 98.7 F (37.1 C) (Temporal)   SpO2 96%  BP Readings from Last 3 Encounters:  11/21/18 118/80  10/05/18 (!) 142/100  01/25/18 128/90        Assessment & Plan:  1. Cellulitis of left leg -Reviewed ER/RTC precautions -Encouraged her to elevate as much as possible - sulfamethoxazole-trimethoprim (BACTRIM DS) 800-160 MG tablet; Take 2 tablets by mouth 2 (two) times daily for 7 days.  Dispense: 28 tablet; Refill: 0 - CBC with Differential -Recurrent, encouraged her to follow-up with dermatology and she agreed to make an appointment, likely continued recurrences until eczema is treated  2. Hypothyroidism associated with surgical procedure - TSH - T4, Free  3. Prediabetes - Lipid Panel - Comprehensive metabolic panel - CBC with Differential - Hemoglobin A1c - TSH  4. Essential hypertension -Improved on HCTZ, check labs today - Comprehensive metabolic panel - CBC with Differential  5. Stress -Provided her with information to contact with [facility] to set up counseling, encouraged her to think about medication and whether or not she thinks this would be helpful for her -Follow-up in 2 months, sooner if symptoms worsen -Encouraged her to take small measures to improve her wellbeing, starting with increased sleep, increased water and movement throughout the day   Clarene Reamer, FNP-BC  [facility] at [facility], [place]  11/21/2018 12:24 PM

## 2018-11-25 MED ORDER — ATORVASTATIN CALCIUM 20 MG PO TABS: 20.0000 mg | ORAL_TABLET | Freq: Every day | ORAL | 3 refills | Status: DC

## 2018-11-25 MED ORDER — METFORMIN HCL ER 500 MG PO TB24: ORAL_TABLET | ORAL | 1 refills | Status: DC

## 2018-11-25 NOTE — Addendum Note (Signed)
Addended by: Clarene Reamer B on: 11/25/2018 01:53 PM   Modules accepted: Orders

## 2018-11-30 ENCOUNTER — Ambulatory Visit: Payer: BC Managed Care – PPO | Admitting: Family Medicine

## 2019-01-09 ENCOUNTER — Encounter: Payer: Self-pay | Admitting: Family Medicine

## 2019-01-09 ENCOUNTER — Telehealth: Payer: Self-pay | Admitting: Family Medicine

## 2019-01-09 NOTE — Telephone Encounter (Signed)
Spoke with pt about scheduling appointment with dr copland .  Pt state she has appointment with debbie 12/18.   She wanted to know if she could transfer care from Dr Lorelei Pont to [facility]

## 2019-01-09 NOTE — Telephone Encounter (Signed)
Yes, fine with me.  She is a nice patient.

## 2019-01-09 NOTE — Telephone Encounter (Signed)
Can you help her get set up for a repeat office visit?  I really wanted her to come in before now to repeat her bloodwork. We can answer all of those questions face to face

## 2019-01-09 NOTE — Telephone Encounter (Signed)
I don't see why this would be a problem for Debbie, please proceed with TOC.

## 2019-01-09 NOTE — Telephone Encounter (Signed)
Error attached to my chart message

## 2019-01-10 NOTE — Telephone Encounter (Signed)
OK for TOC to me. She has upcoming appointment.

## 2019-01-10 NOTE — Telephone Encounter (Signed)
Spoke to pt. She stated she is on a strict diet and eliminated  all sugars refined carbs  She is eating veggies and meat.  She is at a loss has to what to do.  Wanted referral dm nutrition if you think this would help

## 2019-01-11 NOTE — Telephone Encounter (Signed)
Please call patient and see if she is ok to wait until follow up visit to discuss diet and referral further.

## 2019-01-11 NOTE — Telephone Encounter (Signed)
Patient said it's fine to wait until her follow up appointment.

## 2019-01-11 NOTE — Telephone Encounter (Signed)
Left message asking pt to call office  °

## 2019-01-12 LAB — HEMOGLOBIN A1C: Hemoglobin A1C: 6.3

## 2019-01-12 LAB — HEPATIC FUNCTION PANEL
ALT: 43 — AB (ref 7–35)
AST: 23 (ref 13–35)
Alkaline Phosphatase: 54 (ref 25–125)
Bilirubin, Total: 1.3

## 2019-01-12 LAB — COMPREHENSIVE METABOLIC PANEL
Albumin: 4.4 (ref 3.5–5.0)
Calcium: 9.8 (ref 8.7–10.7)
GFR calc Af Amer: 107
GFR calc non Af Amer: 88

## 2019-01-12 LAB — CBC: RBC: 5.22 — AB (ref 3.87–5.11)

## 2019-01-12 LAB — BASIC METABOLIC PANEL
BUN: 19 (ref 4–21)
CO2: 29 — AB (ref 13–22)
Chloride: 100 (ref 99–108)
Creatinine: 0.8 (ref 0.5–1.1)
Glucose: 122
Potassium: 3.9 (ref 3.4–5.3)
Sodium: 139 (ref 137–147)

## 2019-01-12 LAB — CBC AND DIFFERENTIAL
HCT: 46 (ref 36–46)
Hemoglobin: 15.3 (ref 12.0–16.0)
WBC: 7.5

## 2019-01-12 LAB — TSH: TSH: 2.25 (ref 0.41–5.90)

## 2019-01-12 LAB — LIPID PANEL
Cholesterol: 123 (ref 0–200)
HDL: 32 — AB (ref 35–70)
LDL Cholesterol: 73
Triglycerides: 90 (ref 40–160)

## 2019-01-20 ENCOUNTER — Encounter: Payer: Self-pay | Admitting: Family Medicine

## 2019-01-20 ENCOUNTER — Other Ambulatory Visit: Payer: Self-pay

## 2019-01-20 ENCOUNTER — Ambulatory Visit: Payer: BC Managed Care – PPO | Admitting: Family Medicine

## 2019-01-20 VITALS — BP 118/80 | HR 79 | Temp 97.9°F

## 2019-01-20 DIAGNOSIS — L409 Psoriasis, unspecified: Secondary | ICD-10-CM | POA: Diagnosis not present

## 2019-01-20 DIAGNOSIS — E119 Type 2 diabetes mellitus without complications: Secondary | ICD-10-CM

## 2019-01-20 DIAGNOSIS — I10 Essential (primary) hypertension: Secondary | ICD-10-CM | POA: Diagnosis not present

## 2019-01-20 DIAGNOSIS — B351 Tinea unguium: Secondary | ICD-10-CM

## 2019-01-20 MED ORDER — TERBINAFINE HCL 250 MG PO TABS: 250.0000 mg | ORAL_TABLET | Freq: Every day | ORAL | 0 refills | Status: DC

## 2019-01-20 NOTE — Progress Notes (Signed)
Subjective:    Patient ID: Sharon Kerr, female    DOB: [birth year], 43 y.o.   MRN: [id number]  HPI This is a 43 yo female who presents today for follow up of chronic medical conditions.  Obesity/diabetes- She was diagnosed with DM type 2 2 months ago.  She had hemoglobin A1c of 7.2.  She was started on Metformin extended release 500 mg daily.  She has tolerated well.  She has been going to [facility] in [place] and is meeting with a dietician. She has lost over 20 pounds. Has been checking blood sugar. Last hgba1c 6.3.  She is walking twice a day which she feels has significantly improved her mood.  She has been able to see decreases in her leukosis after walking and with making good food choices.  Sees Dr. Corinna Capra at gyn annually and reports being up-to-date on mammogram.  Right great toenail fungus- saw podiatrist who did culture of her nail.  Unable to see the culture was done but not the results.  Patient reports that she was told that it was fungal and they wanted to schedule her a follow-up visit to prescribe oral medication.  Unfortunately, the podiatrist was out of her network and she did not return.  She is interested in having this treated.  Her other nails not affected.  Recurrent cellulitis of lower extremities-was treated with antibiotics in October.  This is resolved.  She has had significant improvement of swelling with weight loss and hydrochlorothiazide 25 mg daily.  She does continue to have some discoloration of her left foot.  Past Medical History:  Diagnosis Date  . Allergic rhinitis due to pollen   . Aortic regurgitation 07/06/2011  . Asthma   . History of high blood pressure    without diag  . Hypothyroidism associated with surgical procedure   . Migraine   . Panic attacks 12/16/2012  . PCOS (polycystic ovarian syndrome) 06/26/2014  . Social anxiety disorder 12/16/2012   Past Surgical History:  Procedure Laterality Date  . BREAST ENHANCEMENT SURGERY  1999  .  CESAREAN SECTION    . FOOT SURGERY  2001  . LEEP    . THYROIDECTOMY, PARTIAL  2009   [facility]   Family History  Problem Relation Age of Onset  . Breast cancer Mother 55   Social History   Tobacco Use  . Smoking status: Never Smoker  . Smokeless tobacco: Never Used  Substance Use Topics  . Alcohol use: No    Alcohol/week: 0.0 standard drinks  . Drug use: No      Review of Systems Per HPI    Objective:   Physical Exam Vitals reviewed.  Constitutional:      General: She is not in acute distress.    Appearance: Normal appearance. She is obese. She is not ill-appearing, toxic-appearing or diaphoretic.  HENT:     Head: Normocephalic and atraumatic.  Cardiovascular:     Rate and Rhythm: Normal rate.  Pulmonary:     Effort: Pulmonary effort is normal.  Skin:    General: Skin is warm and dry.     Comments: Right great toenail thickened and yellow in vertical line. Left lower extremity and top of foot with small amount of hyperpigmentation.  Neurological:     Mental Status: She is alert and oriented to person, place, and time.  Psychiatric:        Mood and Affect: Mood normal.        Behavior: Behavior normal.  Thought Content: Thought content normal.        Judgment: Judgment normal.       BP 118/80 (BP Location: Left Wrist, Patient Position: Sitting, Cuff Size: Normal)   Pulse 79   Temp 97.9 F (36.6 C) (Temporal)   SpO2 99%  Wt Readings from Last 3 Encounters:  07/19/18 (!) 330 lb 9.6 oz (150 kg)  01/16/11 215 lb (97.5 kg)       Assessment & Plan:  1. Onychomycosis of right great toe -Patient had blood work earlier this month at [facility].  She was able to show me her results on her phone including LFTs.  In October when I checked her c-Met, ALT was 49.  Earlier this month, ALT had reduced to 37. -Discussed treatment with terbinafine and possible side effects. - terbinafine (LAMISIL) 250 MG tablet; Take 1 tablet (250 mg total) by mouth daily.  Dispense:  84 tablet; Refill: 0  2. Essential hypertension -Well-controlled with hydrochlorothiazide  3. Type 2 diabetes mellitus without complication, without long-term current use of insulin ([place]) -Significantly improved with starting Metformin and weight loss. -Follow-up in 6 months  4. Psoriasis - improved, she intends to schedule with derm.  - Expect continued improvement with diet and weight loss, discussed anti-inflammatory diet.   5. Morbid obesity ([place]) - She has had significant weight loss over last 2 months and is feeling confident that she can continue on path - provided encouragement   This visit occurred during the SARS-CoV-2 public health emergency.  Safety protocols were in place, including screening questions prior to the visit, additional usage of staff PPE, and extensive cleaning of exam room while observing appropriate contact time as indicated for disinfecting solutions.     Clarene Reamer, FNP-BC  [facility] at [facility], [facility] [place] [facility]  01/20/2019 6:13 PM

## 2019-01-20 NOTE — Patient Instructions (Addendum)
So good to see you today!  I am so proud of you, keep up the good work.  Follow up with me in 6 months Send me your [facility] results so I can put them in your record  I have sent an oral antifungal to your pharmacy. It is once a day for 12 weeks.    Fungal Nail Infection A fungal nail infection is a common infection of the toenails or fingernails. This condition affects toenails more often than fingernails. It often affects the great, or big, toes. More than one nail may be infected. The condition can be passed from person to person (is contagious). What are the causes? This condition is caused by a fungus. Several types of fungi can cause the infection. These fungi are common in moist and warm areas. If your hands or feet come into contact with the fungus, it may get into a crack in your fingernail or toenail and cause the infection. What increases the risk? The following factors may make you more likely to develop this condition:  Being female.  Being of older age.  Living with someone who has the fungus.  Walking barefoot in areas where the fungus thrives, such as showers or locker rooms.  Wearing shoes and socks that cause your feet to sweat.  Having a nail injury or a recent nail surgery.  Having certain medical conditions, such as: ? Athlete's foot. ? Diabetes. ? Psoriasis. ? Poor circulation. ? A weak body defense system (immune system). What are the signs or symptoms? Symptoms of this condition include:  A pale spot on the nail.  Thickening of the nail.  A nail that becomes yellow or brown.  A brittle or ragged nail edge.  A crumbling nail.  A nail that has lifted away from the nail bed. How is this diagnosed? This condition is diagnosed with a physical exam. Your health care provider may take a scraping or clipping from your nail to test for the fungus. How is this treated? Treatment is not needed for mild infections. If you have significant nail changes,  treatment may include:  Antifungal medicines taken by mouth (orally). You may need to take the medicine for several weeks or several months, and you may not see the results for a long time. These medicines can cause side effects. Ask your health care provider what problems to watch for.  Antifungal nail polish or nail cream. These may be used along with oral antifungal medicines.  Laser treatment of the nail.  Surgery to remove the nail. This may be needed for the most severe infections. It can take a long time, usually up to a year, for the infection to go away. The infection may also come back. Follow these instructions at home: Medicines  Take or apply over-the-counter and prescription medicines only as told by your health care provider.  Ask your health care provider about using over-the-counter mentholated ointment on your nails. Nail care  Trim your nails often.  Wash and dry your hands and feet every day.  Keep your feet dry: ? Wear absorbent socks, and change your socks frequently. ? Wear shoes that allow air to circulate, such as sandals or canvas tennis shoes. Throw out old shoes.  Do not use artificial nails.  If you go to a nail salon, make sure you choose one that uses clean instruments.  Use antifungal foot powder on your feet and in your shoes. General instructions  Do not share personal items, such as  towels or nail clippers.  Do not walk barefoot in shower rooms or locker rooms.  Wear rubber gloves if you are working with your hands in wet areas.  Keep all follow-up visits as told by your health care provider. This is important. Contact a health care provider if: Your infection is not getting better or it is getting worse after several months. Summary  A fungal nail infection is a common infection of the toenails or fingernails.  Treatment is not needed for mild infections. If you have significant nail changes, treatment may include taking medicine  orally and applying medicine to your nails.  It can take a long time, usually up to a year, for the infection to go away. The infection may also come back.  Take or apply over-the-counter and prescription medicines only as told by your health care provider.  Follow instructions for taking care of your nails to help prevent infection from coming back or spreading. This information is not intended to replace advice given to you by your health care provider. Make sure you discuss any questions you have with your health care provider. Document Released: 01/17/2000 Document Revised: 05/12/2018 Document Reviewed: 06/25/2017 Elsevier Patient Education  2020 [employer].

## 2019-02-01 ENCOUNTER — Encounter: Payer: Self-pay | Admitting: Family Medicine

## 2019-02-01 LAB — ESTRADIOL: Estradiol: 55.4

## 2019-02-01 LAB — TESTOSTERONE: Testosterone: 74

## 2019-04-04 ENCOUNTER — Other Ambulatory Visit: Payer: Self-pay | Admitting: Family Medicine

## 2019-04-04 DIAGNOSIS — I10 Essential (primary) hypertension: Secondary | ICD-10-CM

## 2019-04-21 LAB — HM DIABETES EYE EXAM

## 2019-04-24 ENCOUNTER — Encounter: Payer: Self-pay | Admitting: Family Medicine

## 2019-05-19 ENCOUNTER — Other Ambulatory Visit: Payer: Self-pay | Admitting: Family Medicine

## 2019-05-19 DIAGNOSIS — E119 Type 2 diabetes mellitus without complications: Secondary | ICD-10-CM

## 2019-05-30 ENCOUNTER — Other Ambulatory Visit: Payer: Self-pay | Admitting: Family Medicine

## 2019-05-30 DIAGNOSIS — E119 Type 2 diabetes mellitus without complications: Secondary | ICD-10-CM

## 2019-07-21 ENCOUNTER — Ambulatory Visit: Payer: BC Managed Care – PPO | Admitting: Family Medicine

## 2019-08-11 ENCOUNTER — Ambulatory Visit: Payer: BC Managed Care – PPO | Admitting: Family Medicine

## 2019-09-11 ENCOUNTER — Ambulatory Visit: Payer: BC Managed Care – PPO | Admitting: Family Medicine

## 2019-11-03 ENCOUNTER — Ambulatory Visit: Payer: BC Managed Care – PPO | Admitting: Family Medicine

## 2019-11-06 ENCOUNTER — Other Ambulatory Visit: Payer: Self-pay | Admitting: Family Medicine

## 2019-11-06 DIAGNOSIS — I10 Essential (primary) hypertension: Secondary | ICD-10-CM

## 2019-11-25 ENCOUNTER — Other Ambulatory Visit: Payer: Self-pay | Admitting: Family Medicine

## 2019-11-25 DIAGNOSIS — E119 Type 2 diabetes mellitus without complications: Secondary | ICD-10-CM

## 2019-11-25 NOTE — Telephone Encounter (Signed)
Called pt advised of refill on metformin 30 day supply and reminded of upcoming appt.

## 2019-11-28 ENCOUNTER — Other Ambulatory Visit: Payer: Self-pay | Admitting: Family Medicine

## 2019-11-28 DIAGNOSIS — E119 Type 2 diabetes mellitus without complications: Secondary | ICD-10-CM

## 2019-12-15 ENCOUNTER — Ambulatory Visit: Payer: BC Managed Care – PPO | Admitting: Family Medicine

## 2019-12-19 ENCOUNTER — Other Ambulatory Visit: Payer: Self-pay | Admitting: Family Medicine

## 2019-12-19 DIAGNOSIS — E119 Type 2 diabetes mellitus without complications: Secondary | ICD-10-CM

## 2019-12-21 NOTE — Telephone Encounter (Signed)
Lvm..per Joellen call pt and reschedule office visit for earlier date, possibly next week. (Pt has canceled several appts)  Once new appt confirmed I will give pt 15 day refill.

## 2020-01-08 ENCOUNTER — Ambulatory Visit: Payer: BC Managed Care – PPO | Admitting: Family Medicine

## 2020-01-08 ENCOUNTER — Other Ambulatory Visit: Payer: Self-pay

## 2020-01-08 VITALS — BP 128/76 | HR 91 | Temp 97.4°F | Resp 16

## 2020-01-08 DIAGNOSIS — E119 Type 2 diabetes mellitus without complications: Secondary | ICD-10-CM | POA: Diagnosis not present

## 2020-01-08 DIAGNOSIS — F411 Generalized anxiety disorder: Secondary | ICD-10-CM

## 2020-01-08 DIAGNOSIS — E890 Postprocedural hypothyroidism: Secondary | ICD-10-CM | POA: Diagnosis not present

## 2020-01-08 DIAGNOSIS — E041 Nontoxic single thyroid nodule: Secondary | ICD-10-CM

## 2020-01-08 DIAGNOSIS — Z23 Encounter for immunization: Secondary | ICD-10-CM

## 2020-01-08 DIAGNOSIS — J3081 Allergic rhinitis due to animal (cat) (dog) hair and dander: Secondary | ICD-10-CM

## 2020-01-08 DIAGNOSIS — R7303 Prediabetes: Secondary | ICD-10-CM

## 2020-01-08 LAB — CBC WITH DIFFERENTIAL/PLATELET
Basophils Absolute: 0.1 10*3/uL (ref 0.0–0.1)
Basophils Relative: 1 % (ref 0.0–3.0)
Eosinophils Absolute: 0.6 10*3/uL (ref 0.0–0.7)
Eosinophils Relative: 6.8 % — ABNORMAL HIGH (ref 0.0–5.0)
HCT: 43.5 % (ref 36.0–46.0)
Hemoglobin: 14.9 g/dL (ref 12.0–15.0)
Lymphocytes Relative: 37.4 % (ref 12.0–46.0)
Lymphs Abs: 3.1 10*3/uL (ref 0.7–4.0)
MCHC: 34.3 g/dL (ref 30.0–36.0)
MCV: 87.3 fl (ref 78.0–100.0)
Monocytes Absolute: 0.6 10*3/uL (ref 0.1–1.0)
Monocytes Relative: 7.8 % (ref 3.0–12.0)
Neutro Abs: 3.9 10*3/uL (ref 1.4–7.7)
Neutrophils Relative %: 47 % (ref 43.0–77.0)
Platelets: 222 10*3/uL (ref 150.0–400.0)
RBC: 4.98 Mil/uL (ref 3.87–5.11)
RDW: 13.7 % (ref 11.5–15.5)
WBC: 8.2 10*3/uL (ref 4.0–10.5)

## 2020-01-08 LAB — COMPREHENSIVE METABOLIC PANEL
ALT: 66 U/L — ABNORMAL HIGH (ref 0–35)
AST: 40 U/L — ABNORMAL HIGH (ref 0–37)
Albumin: 4.1 g/dL (ref 3.5–5.2)
Alkaline Phosphatase: 41 U/L (ref 39–117)
BUN: 10 mg/dL (ref 6–23)
CO2: 28 mEq/L (ref 19–32)
Calcium: 9 mg/dL (ref 8.4–10.5)
Chloride: 100 mEq/L (ref 96–112)
Creatinine, Ser: 0.61 mg/dL (ref 0.40–1.20)
GFR: 108.54 mL/min (ref >60.00)
Glucose, Bld: 157 mg/dL — ABNORMAL HIGH (ref 70–99)
Potassium: 3.9 mEq/L (ref 3.5–5.1)
Sodium: 139 mEq/L (ref 135–145)
Total Bilirubin: 1.2 mg/dL (ref 0.2–1.2)
Total Protein: 6.5 g/dL (ref 6.0–8.3)

## 2020-01-08 LAB — TSH: TSH: 3.24 u[IU]/mL (ref 0.35–4.50)

## 2020-01-08 LAB — HEMOGLOBIN A1C: Hgb A1c MFr Bld: 7.6 % — ABNORMAL HIGH (ref 4.6–6.5)

## 2020-01-08 LAB — VITAMIN D 25 HYDROXY (VIT D DEFICIENCY, FRACTURES): VITD: 28.22 ng/mL — ABNORMAL LOW (ref 30.00–100.00)

## 2020-01-08 MED ORDER — BUSPIRONE HCL 5 MG PO TABS: 5.0000 mg | ORAL_TABLET | Freq: Two times a day (BID) | ORAL | 5 refills | Status: DC

## 2020-01-08 MED ORDER — FLUTICASONE PROPIONATE 50 MCG/ACT NA SUSP: 2.0000 | Freq: Every day | NASAL | 6 refills | Status: DC

## 2020-01-08 MED ORDER — METFORMIN HCL ER 500 MG PO TB24: 500.0000 mg | ORAL_TABLET | Freq: Every day | ORAL | 3 refills | Status: DC

## 2020-01-08 NOTE — Progress Notes (Signed)
   Subjective:    Patient ID: Sharon Kerr, female    DOB: 1975/06/23, 44 y.o.   MRN: [id number]  HPI This is a 44 yo female who presents today for 6 month follow up.   Increased stress, anxiety. Sleeping 4 hours a night. Low energy level. Doesn't feel depressed, feels "frenzied." Trying to get more help at work.   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 Blood sugars were 103-140, hasn't checked in a couple of months.  Struggling to make consistently good food choices with increased stress and workload.  Cellulitis/ psoriasis- no more problems on lower extremities.     Review of Systems Denies chest pain, shortness of breath, abdominal pain     Objective:   Physical Exam Physical Exam  Constitutional: Oriented to person, place, and time. Appears well-developed and well-nourished.  Obese. HENT:  Head: Normocephalic and atraumatic.  Eyes: Conjunctivae are normal.  Neck: Normal range of motion. Neck supple.  Cardiovascular: Normal rate, regular rhythm and normal heart sounds.   Pulmonary/Chest: Effort normal and breath sounds normal.  Musculoskeletal: No lower extremity edema.   Neurological: Alert and oriented to person, place, and time.  Skin: Skin is warm and dry.  Psychiatric: Normal mood and affect. Behavior is normal. Judgment and thought content normal.  Vitals reviewed.    BP 128/76   Pulse 91   Temp (!) 97.4 F (36.3 C)   Resp 16   SpO2 98%    GAD 7 : Generalized Anxiety Score 01/08/2020  Nervous, Anxious, on Edge 3  Control/stop worrying 3  Worry too much - different things 3  Trouble relaxing 3  Restless 3  Easily annoyed or irritable 3  Afraid - awful might happen 3  Total GAD 7 Score 21     Assessment & Plan:  1. Type 2 diabetes mellitus without complication, without long-term current use of insulin (HCC) - discussed diet and exercise, effects of sleep and stress on blood sugar - Hemoglobin A1c -  Comprehensive metabolic panel - CBC with Differential - metFORMIN (GLUCOPHAGE-XR) 500 MG 24 hr tablet; Take 1 tablet (500 mg total) by mouth daily with breakfast.  Dispense: 90 tablet; Refill: 3  2. Hypothyroidism associated with surgical procedure - TSH  3. Thyroid nodule - TSH  5. Morbid obesity (HCC) - Vitamin D, 25-hydroxy  6. Allergic rhinitis due to animal hair and dander - fluticasone (FLONASE) 50 MCG/ACT nasal spray; Place 2 sprays into both nostrils daily.  Dispense: 16 g; Refill: 6  7. Need for immunization against influenza - Flu Vaccine QUAD 36+ mos IM  8. GAD (generalized anxiety disorder) -Discussed medication options and reviewed expectations of treatment.  Discussed nonpharmacologic ways to deal with anxiety and provided written information. - busPIRone (BUSPAR) 5 MG tablet; Take 1 tablet (5 mg total) by mouth 2 (two) times daily.  Dispense: 60 tablet; Refill: 5 -Follow-up in 6 weeks with Dr. Selena Batten  This visit occurred during the SARS-CoV-2 public health emergency.  Safety protocols were in place, including screening questions prior to the visit, additional usage of staff PPE, and extensive cleaning of exam room while observing appropriate contact time as indicated for disinfecting solutions.     Olean Ree, FNP-BC  [facility] at [facility], [facility]  01/11/2020 5:23 PM

## 2020-01-08 NOTE — Patient Instructions (Signed)
Start flonase for allergies. If no significant improvement in 1 week, can add an as needed Claritin or Zyrtec (generic is fine)  There is not one right eating plan for everyone.  It may take trial and error to find what will work for you.  It is important to get adequate protein and fiber with your meals.  It is okay to not eat breakfast or to skip meals if you are not hungry.  Avoid snacking between meals.  Unless you are on a fluid restriction, drink 80 to 90 ounces of water a day.  Suggested resources- www.dietdoctor.com/diabetes/diet www.adaptyourlifeacademy.com-there is a quiz to help you determine how many carbohydrates you should eat a day  www.thefastingmethod.com  If you have diabetes or access to a blood sugar machine, I recommend you check your blood sugar daily and keep a log.  Vary the time you check your blood sugar such as fasting, before meal, 2 hours after a meal and at bedtime.  Look for trends with the foods you are eating and be a scientist of your body.  Here are some guidelines to help you with meal planning -  Avoid all processed and packaged foods (bread, pasta, crackers, chips, etc) and beverages containing calories.  Avoid added sugars and excessive natural sugars.  Pay attention to how you feel if you consume artificial sweeteners.  Do they make you more hungry or raise your blood sugar?  With every meal and snack, aim to get 20 g of protein (3 ounces of meat, 4 ounces of fish, 3 eggs, protein powder, 1 cup [place] yogurt, 1 cup cottage cheese, etc.)  Increase fiber in the form of non-starchy vegetables.  These help you feel full with very little carbohydrates and are good for gut health.  Nonstarchy vegetables include summer squash, onions, peppers, tomatoes, eggplant, broccoli, cauliflower, cabbage, lettuce, spinach.  Have small amounts of good fats such as avocado, nuts, olive oil, nut butters, olives.  Add a little cheese to your meals to make them tasty.   Try to  plan your meals for the week and do some meal preparation when able.  If possible, make lunches for the week ahead of time.  Plan a couple of dinners and make enough so you can have leftovers.  Build in a treat once a week.

## 2020-01-09 ENCOUNTER — Encounter: Payer: Self-pay | Admitting: Family Medicine

## 2020-01-11 ENCOUNTER — Encounter: Payer: Self-pay | Admitting: Family Medicine

## 2020-01-11 DIAGNOSIS — J3081 Allergic rhinitis due to animal (cat) (dog) hair and dander: Secondary | ICD-10-CM | POA: Insufficient documentation

## 2020-01-11 DIAGNOSIS — R7303 Prediabetes: Secondary | ICD-10-CM | POA: Insufficient documentation

## 2020-01-11 DIAGNOSIS — E119 Type 2 diabetes mellitus without complications: Secondary | ICD-10-CM | POA: Insufficient documentation

## 2020-02-15 ENCOUNTER — Ambulatory Visit: Payer: BC Managed Care – PPO | Admitting: Family Medicine

## 2020-02-15 DIAGNOSIS — Z0289 Encounter for other administrative examinations: Secondary | ICD-10-CM

## 2020-03-06 ENCOUNTER — Other Ambulatory Visit: Payer: Self-pay

## 2020-03-06 ENCOUNTER — Encounter: Payer: Self-pay | Admitting: Family Medicine

## 2020-03-06 DIAGNOSIS — E119 Type 2 diabetes mellitus without complications: Secondary | ICD-10-CM

## 2020-03-06 MED ORDER — ATORVASTATIN CALCIUM 20 MG PO TABS: 20.0000 mg | ORAL_TABLET | Freq: Every day | ORAL | 0 refills | Status: DC

## 2020-03-06 NOTE — Telephone Encounter (Signed)
Patient called and scheduled TOC appointment with Dr.Cody on 04/17/20.

## 2020-04-12 ENCOUNTER — Other Ambulatory Visit: Payer: Self-pay | Admitting: *Deleted

## 2020-04-12 ENCOUNTER — Encounter: Payer: Self-pay | Admitting: Family Medicine

## 2020-04-12 DIAGNOSIS — I10 Essential (primary) hypertension: Secondary | ICD-10-CM

## 2020-04-12 MED ORDER — HYDROCHLOROTHIAZIDE 25 MG PO TABS
25.0000 mg | ORAL_TABLET | Freq: Every day | ORAL | 0 refills | Status: DC
Start: 2020-04-12 — End: 2020-07-03

## 2020-04-12 NOTE — Telephone Encounter (Signed)
Last office visit 01/08/2020 with Harlin Heys for DM.  Last refilled 11/07/2019 for #90 with no refills.  TOC scheduled with Dr. Selena Batten on 04/17/2020.

## 2020-04-17 ENCOUNTER — Ambulatory Visit: Payer: BC Managed Care – PPO | Admitting: Family Medicine

## 2020-04-17 ENCOUNTER — Encounter: Payer: Self-pay | Admitting: Family Medicine

## 2020-04-17 ENCOUNTER — Other Ambulatory Visit: Payer: Self-pay

## 2020-04-17 VITALS — BP 130/80 | HR 87 | Temp 97.7°F | Ht 67.0 in | Wt 336.8 lb

## 2020-04-17 DIAGNOSIS — E119 Type 2 diabetes mellitus without complications: Secondary | ICD-10-CM

## 2020-04-17 DIAGNOSIS — I10 Essential (primary) hypertension: Secondary | ICD-10-CM

## 2020-04-17 DIAGNOSIS — E282 Polycystic ovarian syndrome: Secondary | ICD-10-CM | POA: Diagnosis not present

## 2020-04-17 DIAGNOSIS — E890 Postprocedural hypothyroidism: Secondary | ICD-10-CM

## 2020-04-17 DIAGNOSIS — F410 Panic disorder [episodic paroxysmal anxiety] without agoraphobia: Secondary | ICD-10-CM

## 2020-04-17 LAB — POCT GLYCOSYLATED HEMOGLOBIN (HGB A1C): Hemoglobin A1C: 7.1 % — AB (ref 4.0–5.6)

## 2020-04-17 MED ORDER — SEMAGLUTIDE-WEIGHT MANAGEMENT 0.25 MG/0.5ML ~~LOC~~ SOAJ: 0.2500 mg | SUBCUTANEOUS | 0 refills | Status: DC

## 2020-04-17 NOTE — Assessment & Plan Note (Signed)
Lab Results  Component Value Date   TSH 3.24 01/08/2020  At goal. Cont levo 50 mcg

## 2020-04-17 NOTE — Assessment & Plan Note (Addendum)
Lab Results  Component Value Date   HGBA1C 7.1 (A) 04/17/2020   DM improved from last check 3 months ago. Cont metformin 500 mg daily. Cont diet. Weight management start wegovy. Cont statin

## 2020-04-17 NOTE — Assessment & Plan Note (Signed)
BP controlled. Cont HCTZ 25 mg. May consider switching to ACE-I in the future.

## 2020-04-17 NOTE — Assessment & Plan Note (Signed)
Pt notes failed prior dieting. Is interested in trying [facility]. Discussed side effects. This is a reasonable choice given PCOS and T2DM comorbidity. If too expensive will try Saxenda which may be on formulary. Return 1 month for weight check and medication check

## 2020-04-17 NOTE — Assessment & Plan Note (Signed)
Managing OK w/o support. Worried about side effects impacting highly stressful job. Will continue to assess and encourage alternative treatment.

## 2020-04-17 NOTE — Progress Notes (Signed)
Subjective:     Sharon Kerr is a 45 y.o. female presenting for Transitions Of Care (See mychart message you responded to./Requesting PAP and [facility] from [facility])     HPI  #Obesity - also has pcos - interested in wegovy  - neighbor is using this - read about this  - 339 lbs - Failed: [employer], HCG diet (lost 80lbs and hair),  - wants to avoid fad diet - understands low carb is important  #Anxiety - afraid of side effects - does not want to be dull or tired  #Diabetes - has been on metformin for 1 year Lab Results  Component Value Date   HGBA1C 7.1 (A) 04/17/2020     Review of Systems   Social History   Tobacco Use  Smoking Status Never Smoker  Smokeless Tobacco Never Used        Objective:    BP Readings from Last 3 Encounters:  04/17/20 130/80  01/08/20 128/76  01/20/19 118/80   Wt Readings from Last 3 Encounters:  04/17/20 (!) 336 lb 12 oz (152.7 kg)  07/19/18 (!) 330 lb 9.6 oz (150 kg)  01/16/11 215 lb (97.5 kg)    BP 130/80   Pulse 87   Temp 97.7 F (36.5 C) (Temporal)   Ht 5\' 7"  (1.702 m)   Wt (!) 336 lb 12 oz (152.7 kg)   SpO2 98%   BMI 52.74 kg/m    Physical Exam Constitutional:      General: She is not in acute distress.    Appearance: She is well-developed. She is not diaphoretic.  HENT:     Right Ear: External ear normal.     Left Ear: External ear normal.  Eyes:     Conjunctiva/sclera: Conjunctivae normal.  Cardiovascular:     Rate and Rhythm: Normal rate.  Pulmonary:     Effort: Pulmonary effort is normal.  Musculoskeletal:     Cervical back: Neck supple.  Skin:    General: Skin is warm and dry.     Capillary Refill: Capillary refill takes less than 2 seconds.  Neurological:     Mental Status: She is alert. Mental status is at baseline.  Psychiatric:        Mood and Affect: Mood normal.        Behavior: Behavior normal.    Lab Results  Component Value Date   CHOL 123 01/10/2019   HDL 32 (A)  01/10/2019   LDLCALC 73 01/10/2019   TRIG 90 01/10/2019   CHOLHDL 5 11/21/2018          Assessment & Plan:   Problem List Items Addressed This Visit      Cardiovascular and Mediastinum   Essential hypertension, benign    BP controlled. Cont HCTZ 25 mg. May consider switching to ACE-I in the future.         Endocrine   Hypothyroidism associated with surgical procedure (Chronic)    Lab Results  Component Value Date   TSH 3.24 01/08/2020  At goal. Cont levo 50 mcg      PCOS (polycystic ovarian syndrome) - Primary    Discussed condoms if sexually active. Work on weight loss and DM control.       Relevant Medications   Semaglutide-Weight Management 0.25 MG/0.5ML SOAJ   Type 2 diabetes mellitus without complication, without long-term current use of insulin (HCC)    Lab Results  Component Value Date   HGBA1C 7.1 (A) 04/17/2020   DM improved from  last check 3 months ago. Cont metformin 500 mg daily. Cont diet. Weight management start wegovy. Cont statin      Relevant Medications   Semaglutide-Weight Management 0.25 MG/0.5ML SOAJ   Other Relevant Orders   POCT glycosylated hemoglobin (Hb A1C) (Completed)     Other   Panic attacks (Chronic)    Managing OK w/o support. Worried about side effects impacting highly stressful job. Will continue to assess and encourage alternative treatment.       Morbid obesity (HCC)    Pt notes failed prior dieting. Is interested in trying [facility]. Discussed side effects. This is a reasonable choice given PCOS and T2DM comorbidity. If too expensive will try Saxenda which may be on formulary. Return 1 month for weight check and medication check      Relevant Medications   Semaglutide-Weight Management 0.25 MG/0.5ML SOAJ       Return in about 4 weeks (around 05/15/2020).  Lynnda Child, MD  This visit occurred during the SARS-CoV-2 public health emergency.  Safety protocols were in place, including screening questions prior to the  visit, additional usage of staff PPE, and extensive cleaning of exam room while observing appropriate contact time as indicated for disinfecting solutions.

## 2020-04-17 NOTE — Patient Instructions (Signed)
#Weight loss - work on diet and exercise - Send in [place] - if not covered -- Saxenda   Liraglutide injection (Weight Management) What is this medicine? LIRAGLUTIDE (LIR a GLOO tide) is used to help people lose weight and maintain weight loss. It is used with a reduced-calorie diet and exercise. This medicine may be used for other purposes; ask your health care provider or pharmacist if you have questions. COMMON BRAND NAME(S): Saxenda What should I tell my health care provider before I take this medicine? They need to know if you have any of these conditions:  endocrine tumors (MEN 2) or if someone in your family had these tumors  gallbladder disease  high cholesterol  history of alcohol abuse problem  history of pancreatitis  kidney disease or if you are on dialysis  liver disease  previous swelling of the tongue, face, or lips with difficulty breathing, difficulty swallowing, hoarseness, or tightening of the throat  stomach problems  suicidal thoughts, plans, or attempt; a previous suicide attempt by you or a family member  thyroid cancer or if someone in your family had thyroid cancer  an unusual or allergic reaction to liraglutide, other medicines, foods, dyes, or preservatives  pregnant or trying to get pregnant  breast-feeding How should I use this medicine? This medicine is for injection under the skin of your upper leg, stomach area, or upper arm. You will be taught how to prepare and give this medicine. Use exactly as directed. Take your medicine at regular intervals. Do not take it more often than directed. This medicine comes with INSTRUCTIONS FOR USE. Ask your pharmacist for directions on how to use this medicine. Read the information carefully. Talk to your pharmacist or health care provider if you have questions. It is important that you put your used needles and syringes in a special sharps container. Do not put them in a trash can. If you do not have a  sharps container, call your pharmacist or healthcare provider to get one. A special MedGuide will be given to you by the pharmacist with each prescription and refill. Be sure to read this information carefully each time. Talk to your health care provider about the use of this medicine in children. While it may be prescribed for children as young as 66 years of age for selected conditions, precautions do apply. Overdosage: If you think you have taken too much of this medicine contact a poison control center or emergency room at once. NOTE: This medicine is only for you. Do not share this medicine with others. What if I miss a dose? If you miss a dose, take it as soon as you can. If it is almost time for your next dose, take only that dose. Do not take double or extra doses. If you miss your dose for 3 days or more, call your doctor or health care professional to talk about how to restart this medicine. What may interact with this medicine?  insulin and other medicines for diabetes This list may not describe all possible interactions. Give your health care provider a list of all the medicines, herbs, non-prescription drugs, or dietary supplements you use. Also tell them if you smoke, drink alcohol, or use illegal drugs. Some items may interact with your medicine. What should I watch for while using this medicine? Visit your doctor or health care professional for regular checks on your progress. Drink plenty of fluids while taking this medicine. Check with your doctor or health care professional if  you get an attack of severe diarrhea, nausea, and vomiting. The loss of too much body fluid can make it dangerous for you to take this medicine. This medicine may affect blood sugar levels. Ask your healthcare provider if changes in diet or medicines are needed if you have diabetes. Patients and their families should watch out for worsening depression or thoughts of suicide. Also watch out for sudden changes  in feelings such as feeling anxious, agitated, panicky, irritable, hostile, aggressive, impulsive, severely restless, overly excited and hyperactive, or not being able to sleep. If this happens, especially at the beginning of treatment or after a change in dose, call your health care professional. Women should inform their health care provider if they wish to become pregnant or think they might be pregnant. Losing weight while pregnant is not advised and may cause harm to the unborn child. Talk to your health care provider for more information. What side effects may I notice from receiving this medicine? Side effects that you should report to your doctor or health care professional as soon as possible:  allergic reactions like skin rash, itching or hives, swelling of the face, lips, or tongue  breathing problems  diarrhea that continues or is severe  lump or swelling on the neck  severe nausea  signs and symptoms of infection like fever or chills; cough; sore throat; pain or trouble passing urine  signs and symptoms of low blood sugar such as feeling anxious; confusion; dizziness; increased hunger; unusually weak or tired; increased sweating; shakiness; cold, clammy skin; irritable; headache; blurred vision; fast heartbeat; loss of consciousness  signs and symptoms of kidney injury like trouble passing urine or change in the amount of urine  trouble swallowing  unusual stomach upset or pain  vomiting Side effects that usually do not require medical attention (report to your doctor or health care professional if they continue or are bothersome):  constipation  decreased appetite  diarrhea  fatigue  headache  nausea  pain, redness, or irritation at site where injected  stomach upset  stuffy or runny nose This list may not describe all possible side effects. Call your doctor for medical advice about side effects. You may report side effects to FDA at 1-800-FDA-1088. Where  should I keep my medicine? Keep out of the reach of children. Store unopened pen in a refrigerator between 2 and 8 degrees C (36 and 46 degrees F). Do not freeze or use if the medicine has been frozen. Protect from light and excessive heat. After you first use the pen, it can be stored at room temperature between 15 and 30 degrees C (59 and 86 degrees F) or in a refrigerator. Throw away your used pen after 30 days or after the expiration date, whichever comes first. Do not store your pen with the needle attached. If the needle is left on, medicine may leak from the pen. NOTE: This sheet is a summary. It may not cover all possible information. If you have questions about this medicine, talk to your doctor, pharmacist, or health care provider.  2021 Elsevier/Gold Standard (2019-01-12 13:56:25)

## 2020-04-17 NOTE — Assessment & Plan Note (Signed)
Discussed condoms if sexually active. Work on weight loss and DM control.

## 2020-04-18 ENCOUNTER — Encounter: Payer: Self-pay | Admitting: Family Medicine

## 2020-04-26 ENCOUNTER — Encounter: Payer: Self-pay | Admitting: Family Medicine

## 2020-05-01 ENCOUNTER — Encounter: Payer: Self-pay | Admitting: Family Medicine

## 2020-05-26 ENCOUNTER — Encounter: Payer: Self-pay | Admitting: Family Medicine

## 2020-05-27 ENCOUNTER — Ambulatory Visit: Payer: BC Managed Care – PPO | Admitting: Family Medicine

## 2020-05-27 DIAGNOSIS — Z0289 Encounter for other administrative examinations: Secondary | ICD-10-CM

## 2020-06-27 ENCOUNTER — Ambulatory Visit: Payer: BC Managed Care – PPO | Admitting: Family Medicine

## 2020-07-03 ENCOUNTER — Other Ambulatory Visit: Payer: Self-pay | Admitting: Family Medicine

## 2020-07-03 DIAGNOSIS — E119 Type 2 diabetes mellitus without complications: Secondary | ICD-10-CM

## 2020-07-03 DIAGNOSIS — I10 Essential (primary) hypertension: Secondary | ICD-10-CM

## 2020-08-15 ENCOUNTER — Other Ambulatory Visit: Payer: Self-pay | Admitting: Family Medicine

## 2020-08-15 DIAGNOSIS — I10 Essential (primary) hypertension: Secondary | ICD-10-CM

## 2020-08-19 NOTE — Telephone Encounter (Signed)
Left voice message to call the office  

## 2020-08-22 NOTE — Telephone Encounter (Signed)
Spoke with patient stated she will call back to scheduled appointment

## 2020-09-03 LAB — HM PAP SMEAR

## 2020-09-03 LAB — RESULTS CONSOLE HPV: CHL HPV: NEGATIVE

## 2020-09-03 LAB — HM MAMMOGRAPHY

## 2020-10-01 ENCOUNTER — Other Ambulatory Visit: Payer: Self-pay | Admitting: Family Medicine

## 2020-10-01 DIAGNOSIS — E119 Type 2 diabetes mellitus without complications: Secondary | ICD-10-CM

## 2020-10-26 ENCOUNTER — Other Ambulatory Visit: Payer: Self-pay | Admitting: Family Medicine

## 2020-10-26 DIAGNOSIS — I10 Essential (primary) hypertension: Secondary | ICD-10-CM

## 2020-10-26 DIAGNOSIS — E119 Type 2 diabetes mellitus without complications: Secondary | ICD-10-CM

## 2021-02-05 ENCOUNTER — Other Ambulatory Visit: Payer: Self-pay | Admitting: Family Medicine

## 2021-02-05 DIAGNOSIS — E119 Type 2 diabetes mellitus without complications: Secondary | ICD-10-CM

## 2021-02-05 DIAGNOSIS — I10 Essential (primary) hypertension: Secondary | ICD-10-CM

## 2021-02-06 ENCOUNTER — Other Ambulatory Visit: Payer: Self-pay

## 2021-02-06 ENCOUNTER — Encounter: Payer: Self-pay | Admitting: *Deleted

## 2021-02-06 ENCOUNTER — Emergency Department: Payer: BC Managed Care – PPO

## 2021-02-06 ENCOUNTER — Emergency Department
Admission: EM | Admit: 2021-02-06 | Discharge: 2021-02-06 | Disposition: A | Discharge status: Home / Self Care | Payer: BC Managed Care – PPO | Attending: Emergency Medicine | Admitting: Emergency Medicine

## 2021-02-06 DIAGNOSIS — Z5321 Procedure and treatment not carried out due to patient leaving prior to being seen by health care provider: Secondary | ICD-10-CM | POA: Insufficient documentation

## 2021-02-06 DIAGNOSIS — R079 Chest pain, unspecified: Secondary | ICD-10-CM | POA: Diagnosis present

## 2021-02-06 LAB — BASIC METABOLIC PANEL
Anion gap: 10 (ref 5–15)
BUN: 20 mg/dL (ref 6–20)
CO2: 26 mmol/L (ref 22–32)
Calcium: 8.9 mg/dL (ref 8.9–10.3)
Chloride: 99 mmol/L (ref 98–111)
Creatinine, Ser: 0.89 mg/dL (ref 0.44–1.00)
GFR, Estimated: >60 mL/min (ref >60)
Glucose, Bld: 114 mg/dL — ABNORMAL HIGH (ref 70–99)
Potassium: 3.1 mmol/L — ABNORMAL LOW (ref 3.5–5.1)
Sodium: 135 mmol/L (ref 135–145)

## 2021-02-06 LAB — CBC
HCT: 47.1 % — ABNORMAL HIGH (ref 36.0–46.0)
Hemoglobin: 15.8 g/dL — ABNORMAL HIGH (ref 12.0–15.0)
MCH: 29.4 pg (ref 26.0–34.0)
MCHC: 33.5 g/dL (ref 30.0–36.0)
MCV: 87.5 fL (ref 80.0–100.0)
Platelets: 286 10*3/uL (ref 150–400)
RBC: 5.38 MIL/uL — ABNORMAL HIGH (ref 3.87–5.11)
RDW: 13.2 % (ref 11.5–15.5)
WBC: 9.7 10*3/uL (ref 4.0–10.5)
nRBC: 0 % (ref 0.0–0.2)

## 2021-02-06 LAB — TROPONIN I (HIGH SENSITIVITY): Troponin I (High Sensitivity): 5 ng/L (ref <18)

## 2021-02-06 NOTE — ED Provider Triage Note (Signed)
Emergency Medicine Provider Triage Evaluation Note  Sharon Kerr , a 46 y.o. female  was evaluated in triage.  Pt complains of chest pain x3 to 4 days.  Heart racing.  Radiates into the shoulder.  Unsure if it is anxiety or heart attack..  Review of Systems  Positive: Chest pain, radiation of pain, heart racing Negative: Fever, chills, vomiting diarrhea  Physical Exam  BP (!) 129/113 (BP Location: Left Arm)    Pulse (!) 115    Temp 99 F (37.2 C) (Oral)    Resp 20    Ht 5\' 7"  (1.702 m)    Wt (!) 149.7 kg    LMP 12/26/2020    SpO2 99%    BMI 51.69 kg/m  Gen:   Awake, no distress   Resp:  Normal effort  MSK:   Moves extremities without difficulty  Other:    Medical Decision Making  Medically screening exam initiated at 5:39 PM.  Appropriate orders placed.  Sharon Kerr was informed that the remainder of the evaluation will be completed by another provider, this initial triage assessment does not replace that evaluation, and the importance of remaining in the ED until their evaluation is complete.     Sharon Minium, PA-C 02/06/21 1742

## 2021-02-06 NOTE — ED Triage Notes (Signed)
Pt reports chest pain for 5 days.  Pt states intermittent racing heart.  No n/v/  no sob.  No cough.  No fever  pt alert  speech clear.

## 2021-02-07 ENCOUNTER — Encounter: Payer: Self-pay | Admitting: Family Medicine

## 2021-02-07 ENCOUNTER — Telehealth: Payer: BC Managed Care – PPO | Admitting: Family Medicine

## 2021-02-07 DIAGNOSIS — F410 Panic disorder [episodic paroxysmal anxiety] without agoraphobia: Secondary | ICD-10-CM

## 2021-02-07 MED ORDER — TRIAMCINOLONE ACETONIDE 0.1 % EX CREA: 1.0000 "application " | TOPICAL_CREAM | Freq: Two times a day (BID) | CUTANEOUS | 0 refills | Status: DC

## 2021-02-07 MED ORDER — BUSPIRONE HCL 15 MG PO TABS: 7.5000 mg | ORAL_TABLET | Freq: Two times a day (BID) | ORAL | 3 refills | Status: DC

## 2021-02-07 NOTE — Progress Notes (Signed)
Virtual Visit via Video Note  I connected with Sharon Kerr on 02/07/21 at 12:00 PM EST by a video enabled telemedicine application and verified that I am speaking with the correct person using two identifiers.  Location: Patient: home Provider: office    I discussed the limitations of evaluation and management by telemedicine and the availability of in person appointments. The patient expressed understanding and agreed to proceed.  Parties involved in encounter  Patient: Sharon Kerr   Provider:  Roxy Manns MD   Video failed today so visit done with audio only (pt could see [place] but her video did not work)   History of Present Illness: Pt presents to discuss panic attacks  46 yo pt of Dr Selena Batten with h/o HTN and pcos and anxiety and morbid obesity   She went to ER yesterday  Panic attacks worse for the past 7d  Feels fine one minute and then flushing and cp/arm pain and rapid HR  Was a little dehydrated last night  Not this first time this has happened   Stress is not the worst it has ever been   A lot of change happening /just sold house and moving into a new house  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   Self care Started walking for the past 2 weeks (feels good while she is doing it)   She has taken lexapro in the past and did not like it  Made her feel dull and tired and less creative   Sleep is very broken up, has a hard time falling asleep because her brain keeps going  Then anxiety wakes her up early   Has never seen a counselor  Is interested   Touches of depression here and there/does not last and not now    Results for orders placed or performed during the hospital encounter of 02/06/21  Basic metabolic panel  Result Value Ref Range   Sodium 135 135 - 145 mmol/L   Potassium 3.1 (L) 3.5 - 5.1 mmol/L   Chloride 99 98 - 111 mmol/L   CO2 26 22 - 32 mmol/L   Glucose, Bld 114 (H) 70 - 99  mg/dL   BUN 20 6 - 20 mg/dL   Creatinine, Ser 0.86 0.44 - 1.00 mg/dL   Calcium 8.9 8.9 - 57.8 mg/dL   GFR, Estimated >46 >96 mL/min   Anion gap 10 5 - 15  CBC  Result Value Ref Range   WBC 9.7 4.0 - 10.5 K/uL   RBC 5.38 (H) 3.87 - 5.11 MIL/uL   Hemoglobin 15.8 (H) 12.0 - 15.0 g/dL   HCT 29.5 (H) 28.4 - 13.2 %   MCV 87.5 80.0 - 100.0 fL   MCH 29.4 26.0 - 34.0 pg   MCHC 33.5 30.0 - 36.0 g/dL   RDW 44.0 10.2 - 72.5 %   Platelets 286 150 - 400 K/uL   nRBC 0.0 0.0 - 0.2 %  Troponin I (High Sensitivity)  Result Value Ref Range   Troponin I (High Sensitivity) 5 <18 ng/L   DG Chest 2 View  Result Date: 02/06/2021 CLINICAL DATA:  chest pain EXAM: CHEST - 2 VIEW COMPARISON:  None. FINDINGS: The heart and mediastinal contours are within normal limits. No focal consolidation. No pulmonary edema. No pleural effusion. No pneumothorax. No acute osseous abnormality. IMPRESSION: No active cardiopulmonary disease. Electronically Signed   By: Normajean Glasgow.D.  On: 02/06/2021 18:11    EKG showed sinus tachycardia with rate of 118   H/o thyroid problems Lab Results  Component Value Date   TSH 3.24 01/08/2020  On levothyroxine 50 mcg   Psoriasis is worse when she is stressed out   Patient Active Problem List   Diagnosis Date Noted   Type 2 diabetes mellitus without complication, without long-term current use of insulin (HCC) 01/11/2020   Allergic rhinitis due to animal hair and dander 01/11/2020   Essential hypertension, benign 02/07/2016   PCOS (polycystic ovarian syndrome) 06/26/2014   Hypothyroidism associated with surgical procedure    Social anxiety disorder 12/16/2012   Panic attacks 12/16/2012   Allergic rhinitis due to pollen    Thyroid nodule 07/08/2011   Morbid obesity (HCC) 07/08/2011   Aortic regurgitation 07/06/2011   ACUTE GOUTY ARTHROPATHY 10/25/2009   Asthma 11/23/2007   Past Medical History:  Diagnosis Date   Allergic rhinitis due to pollen    Aortic  regurgitation 07/06/2011   Asthma    History of high blood pressure    without diag   Hypothyroidism associated with surgical procedure    Migraine    Panic attacks 12/16/2012   PCOS (polycystic ovarian syndrome) 06/26/2014   Social anxiety disorder 12/16/2012   Past Surgical History:  Procedure Laterality Date   BREAST ENHANCEMENT SURGERY  1999   CESAREAN SECTION     FOOT SURGERY  2001   LEEP     THYROIDECTOMY, PARTIAL  2009   [facility]   Social History   Tobacco Use   Smoking status: Never   Smokeless tobacco: Never  Vaping Use   Vaping Use: Never used  Substance Use Topics   Alcohol use: No    Alcohol/week: 0.0 standard drinks   Drug use: No   Family History  Problem Relation Age of Onset   Breast cancer Mother 15   Allergies  Allergen Reactions   Oxycodone Itching and Rash    Other Reaction: Red all over   Codeine    Current Outpatient Medications on File Prior to Visit  Medication Sig Dispense Refill   atorvastatin (LIPITOR) 20 MG tablet TAKE 1 TABLET BY MOUTH EVERY DAY 90 tablet 0   fluticasone (FLONASE) 50 MCG/ACT nasal spray Place 2 sprays into both nostrils daily. (Patient taking differently: Place 2 sprays into both nostrils daily as needed.) 16 g 6   hydrochlorothiazide (HYDRODIURIL) 25 MG tablet TAKE 1 TABLET (25 MG TOTAL) BY MOUTH DAILY. 90 tablet 0   levothyroxine (SYNTHROID) 50 MCG tablet Take 50 mcg by mouth daily.     metFORMIN (GLUCOPHAGE-XR) 500 MG 24 hr tablet Take 1 tablet (500 mg total) by mouth daily with breakfast. 90 tablet 3   No current facility-administered medications on file prior to visit.   Review of Systems  Constitutional:  Positive for malaise/fatigue. Negative for chills and fever.  HENT:  Negative for congestion, ear pain, sinus pain and sore throat.   Eyes:  Negative for blurred vision, discharge and redness.  Respiratory:  Negative for cough, shortness of breath and stridor.   Cardiovascular:  Negative for chest pain, palpitations  and leg swelling.  Gastrointestinal:  Negative for abdominal pain, diarrhea, nausea and vomiting.  Musculoskeletal:  Negative for myalgias.  Skin:  Negative for rash.  Neurological:  Negative for dizziness and headaches.  Psychiatric/Behavioral:  Negative for depression, substance abuse and suicidal ideas. The patient is nervous/anxious and has insomnia.    Observations/Objective: Pt sounds well/ in no distress  and not currently having a panic attack Anxious mood  Not tearful  No SI Pleasant and talkative with good insight and attentiveness  Nl cognition/good historian    Assessment and Plan: Problem List Items Addressed This Visit       Other   Panic attacks - Primary (Chronic)    With what sounds like GAD  Reviewed chart and history in detail (physical and mental health) as well as med list Recommend counseling plus medication after long discussion Reviewed stressors/ coping techniques/symptoms/ support sources/ tx options and side effects in detail today  Very motivated to get better Given resources for CBT counseling-to get set up (will call if needs referral)  Stressed imp of self care, including exercise and socialization Discussed usefulness of meditation and mindfulness Px buspar 7.5 mg bid  Discussed expectations of this medication including time to effectiveness and mechanism of action, also poss of side effects (early and late)- including mental fuzziness, weight or appetite change, nausea and poss of worse dep or anxiety (even suicidal thoughts)  Pt voiced understanding and will stop med and update if this occurs    Plan f/u in approx 1 mo , can be virtual if not back in our office         Relevant Medications   busPIRone (BUSPAR) 15 MG tablet     Follow Up Instructions: Focus on self care the best you can Keep walking  Avoid excessive caffeine or alcohol   I like the Calm app for meditation and mindfulness Books by March RummageJon Kabbit-Zin are great also as an  intro  Start buspar 7.5 mg twice daily  If intolerable side effects or if you feel worse stop it an let us know   If at any time you feel severely depressed or suicidal seek care and alert us   Work towards getting some counseling Mental health resources for therapy:   [facility] [address] [place], [place] [address]  [phone]  [facility]/ Dr Regan Lemmingnoriode Edeh Address: [address], [place], [place] [address] Phone: [phone]  [facility] Address: [address], [place], [place] [address] Phone: [phone]  [facility] [address] [place], [place] [address] [phone]  [facility]  [address] [place]  Phone: [phone]  Better Help is an online source (look for on line)    I discussed the assessment and treatment plan with the patient. The patient was provided an opportunity to ask questions and all were answered. The patient agreed with the plan and demonstrated an understanding of the instructions.   The patient was advised to call back or seek an in-person evaluation if the symptoms worsen or if the condition fails to improve as anticipated.  I provided 29 minutes of non-face-to-face time during this encounter.   Roxy MannsMarne Jomarion Mish, MD

## 2021-02-07 NOTE — Patient Instructions (Addendum)
Focus on self care the best you can Keep walking  Avoid excessive caffeine or alcohol   I like the Calm app for meditation and mindfulness Books by March Rummage are great also as an intro  Start buspar 7.5 mg twice daily  If intolerable side effects or if you feel worse stop it an let us know   If at any time you feel severely depressed or suicidal seek care and alert [place]   Work towards getting some counseling Mental health resources for therapy:   [facility] [address] [place] [place], [place] [address]  [phone]  [facility]/ Dr Regan Lemming Address: [address] [place], [place] [address] Phone: [phone]  [facility] Address: [address], [place], [place] [address] Phone: [phone]  [facility] [address] [place], [place] [address] [phone]  [facility]  [address] [place]  Phone: [phone]  Better Help is an online source (look for on line)   Schedule follow up in approx 1 month

## 2021-02-07 NOTE — Assessment & Plan Note (Signed)
With what sounds like GAD  Reviewed chart and history in detail (physical and mental health) as well as med list Recommend counseling plus medication after long discussion Reviewed stressors/ coping techniques/symptoms/ support sources/ tx options and side effects in detail today  Very motivated to get better Given resources for CBT counseling-to get set up (will call if needs referral)  Stressed imp of self care, including exercise and socialization Discussed usefulness of meditation and mindfulness Px buspar 7.5 mg bid  Discussed expectations of this medication including time to effectiveness and mechanism of action, also poss of side effects (early and late)- including mental fuzziness, weight or appetite change, nausea and poss of worse dep or anxiety (even suicidal thoughts)  Pt voiced understanding and will stop med and update if this occurs    Plan f/u in approx 1 mo , can be virtual if not back in our office

## 2021-02-08 ENCOUNTER — Telehealth: Payer: Self-pay | Admitting: Family Medicine

## 2021-02-08 DIAGNOSIS — E119 Type 2 diabetes mellitus without complications: Secondary | ICD-10-CM

## 2021-02-08 DIAGNOSIS — I10 Essential (primary) hypertension: Secondary | ICD-10-CM

## 2021-02-21 ENCOUNTER — Other Ambulatory Visit: Payer: Self-pay | Admitting: Family Medicine

## 2021-02-21 DIAGNOSIS — E119 Type 2 diabetes mellitus without complications: Secondary | ICD-10-CM

## 2021-02-21 DIAGNOSIS — I10 Essential (primary) hypertension: Secondary | ICD-10-CM

## 2021-02-21 MED ORDER — HYDROCHLOROTHIAZIDE 25 MG PO TABS: 25.0000 mg | ORAL_TABLET | Freq: Every day | ORAL | 0 refills | Status: DC

## 2021-02-21 MED ORDER — ATORVASTATIN CALCIUM 20 MG PO TABS: 20.0000 mg | ORAL_TABLET | Freq: Every day | ORAL | 0 refills | Status: DC

## 2021-02-21 NOTE — Telephone Encounter (Signed)
Pt called stating that she needs those medication. Pt states that she is experiencing headaches already. Please advise.

## 2021-02-24 NOTE — Telephone Encounter (Signed)
Refills sent on 02/21/21

## 2021-03-02 ENCOUNTER — Other Ambulatory Visit: Payer: Self-pay | Admitting: Family Medicine

## 2021-03-04 ENCOUNTER — Encounter: Payer: Self-pay | Admitting: Nurse Practitioner

## 2021-03-04 ENCOUNTER — Other Ambulatory Visit: Payer: Self-pay

## 2021-03-04 ENCOUNTER — Encounter: Payer: Self-pay | Admitting: Family Medicine

## 2021-03-04 ENCOUNTER — Telehealth (INDEPENDENT_AMBULATORY_CARE_PROVIDER_SITE_OTHER): Payer: BC Managed Care – PPO | Admitting: Nurse Practitioner

## 2021-03-04 DIAGNOSIS — U071 COVID-19: Secondary | ICD-10-CM | POA: Diagnosis not present

## 2021-03-04 DIAGNOSIS — R062 Wheezing: Secondary | ICD-10-CM | POA: Insufficient documentation

## 2021-03-04 MED ORDER — ALBUTEROL SULFATE HFA 108 (90 BASE) MCG/ACT IN AERS: 2.0000 | INHALATION_SPRAY | Freq: Four times a day (QID) | RESPIRATORY_TRACT | 0 refills | Status: DC | PRN

## 2021-03-04 MED ORDER — MOLNUPIRAVIR EUA 200MG CAPSULE
4.0000 | ORAL_CAPSULE | Freq: Two times a day (BID) | ORAL | 0 refills | Status: AC
Start: 2021-03-04 — End: 2021-03-09

## 2021-03-04 NOTE — Assessment & Plan Note (Signed)
Patient states that she is wheezing.  Does have a history of asthma listed in chart but no current inhalers on medication list.  We will send an albuterol inhaler to use as needed's common side effects reviewed with patient continue to monitor signs and symptoms reviewed when to seek urgent or emergent health care.

## 2021-03-04 NOTE — Assessment & Plan Note (Signed)
Patient tested positive for COVID-19.  Did discuss antiviral treatments are EUA only.  After discussion patient elected to pursue antiviral treatment.  Did discuss common side effects inclusive of reproductive uncertainty with antiviral chosen.  Did discuss CDC guidelines in regards to quarantining.  Did discuss signs and symptoms when to seek urgent or emergent health care. Start molnupiravir as soon as possible

## 2021-03-04 NOTE — Progress Notes (Signed)
Patient ID: Sharon Kerr, female    DOB: 1975-07-02, 46 y.o.   MRN: [id number]  Virtual visit completed through [employer], a video enabled telemedicine application. Due to national recommendations of social distancing due to COVID-19, a virtual visit is felt to be most appropriate for this patient at this time. Reviewed limitations, risks, security and privacy concerns of performing a virtual visit and the availability of in person appointments. I also reviewed that there may be a patient responsible charge related to this service. The patient agreed to proceed.   Patient location: home Provider location: [place] [facility], office Persons participating in this virtual visit: patient, provider   If any vitals were documented, they were collected by patient at home unless specified below.       CC: Covid 19 Subjective:   HPI: Sharon Kerr is a 46 y.o. female presenting on 03/04/2021 for Covid Positive (On 03/03/21, sx started on 03/02/21-headache, fever, chills, fatigue, congestion, cough, runny nose.)   Symptoms started on 03/02/2021 Covid test yesterday that was positive  Moderna x2 and one booster No known sick contact Has been using Vicks and nyquill without good relief     Relevant past medical, surgical, family and social history reviewed and updated as indicated. Interim medical history since our last visit reviewed. Allergies and medications reviewed and updated. Outpatient Medications Prior to Visit  Medication Sig Dispense Refill   atorvastatin (LIPITOR) 20 MG tablet Take 1 tablet (20 mg total) by mouth daily. 30 tablet 0   busPIRone (BUSPAR) 15 MG tablet Take 0.5 tablets (7.5 mg total) by mouth 2 (two) times daily. 30 tablet 3   fluticasone (FLONASE) 50 MCG/ACT nasal spray Place 2 sprays into both nostrils daily. (Patient taking differently: Place 2 sprays into both nostrils daily as needed.) 16 g 6   hydrochlorothiazide (HYDRODIURIL) 25 MG tablet Take 1 tablet  (25 mg total) by mouth daily. 30 tablet 0   levothyroxine (SYNTHROID) 50 MCG tablet Take 50 mcg by mouth daily.     metFORMIN (GLUCOPHAGE-XR) 500 MG 24 hr tablet Take 1 tablet (500 mg total) by mouth daily with breakfast. 90 tablet 3   triamcinolone cream (KENALOG) 0.1 % Apply 1 application topically 2 (two) times daily. To affected areas of psoriasis 30 g 0   No facility-administered medications prior to visit.     Per HPI unless specifically indicated in ROS section below Review of Systems  Constitutional:  Positive for appetite change, chills, fatigue and fever.  HENT:  Positive for congestion, postnasal drip, sinus pressure and sore throat (improved). Negative for ear discharge, ear pain and sinus pain.   Respiratory:  Positive for cough (not productive) and wheezing. Negative for shortness of breath.   Cardiovascular:  Negative for chest pain.  Gastrointestinal:  Negative for abdominal pain, diarrhea, nausea and vomiting.  Musculoskeletal:  Positive for arthralgias and myalgias.  Neurological:  Positive for dizziness and headaches.  Objective:    Wt Readings from Last 3 Encounters:  02/06/21 (!) 330 lb (149.7 kg)  04/17/20 (!) 336 lb 12 oz (152.7 kg)  07/19/18 (!) 330 lb 9.6 oz (150 kg)       Physical exam: Gen: alert, NAD, not ill appearing Pulm: speaks in complete sentences without increased work of breathing Psych: normal mood, normal thought content      Results for orders placed or performed during the hospital encounter of 02/06/21  Basic metabolic panel  Result Value Ref Range   Sodium 135 135 -  145 mmol/L   Potassium 3.1 (L) 3.5 - 5.1 mmol/L   Chloride 99 98 - 111 mmol/L   CO2 26 22 - 32 mmol/L   Glucose, Bld 114 (H) 70 - 99 mg/dL   BUN 20 6 - 20 mg/dL   Creatinine, Ser 8.41 0.44 - 1.00 mg/dL   Calcium 8.9 8.9 - 66.0 mg/dL   GFR, Estimated >63 >01 mL/min   Anion gap 10 5 - 15  CBC  Result Value Ref Range   WBC 9.7 4.0 - 10.5 K/uL   RBC 5.38 (H) 3.87 - 5.11  MIL/uL   Hemoglobin 15.8 (H) 12.0 - 15.0 g/dL   HCT 60.1 (H) 09.3 - 23.5 %   MCV 87.5 80.0 - 100.0 fL   MCH 29.4 26.0 - 34.0 pg   MCHC 33.5 30.0 - 36.0 g/dL   RDW 57.3 22.0 - 25.4 %   Platelets 286 150 - 400 K/uL   nRBC 0.0 0.0 - 0.2 %  Troponin I (High Sensitivity)  Result Value Ref Range   Troponin I (High Sensitivity) 5 <18 ng/L   Assessment & Plan:   Problem List Items Addressed This Visit       Other   COVID-19 - Primary    Patient tested positive for COVID-19.  Did discuss antiviral treatments are EUA only.  After discussion patient elected to pursue antiviral treatment.  Did discuss common side effects inclusive of reproductive uncertainty with antiviral chosen.  Did discuss CDC guidelines in regards to quarantining.  Did discuss signs and symptoms when to seek urgent or emergent health care. Start molnupiravir as soon as possible      Relevant Medications   molnupiravir EUA (LAGEVRIO) 200 mg CAPS capsule   Wheezing    Patient states that she is wheezing.  Does have a history of asthma listed in chart but no current inhalers on medication list.  We will send an albuterol inhaler to use as needed's common side effects reviewed with patient continue to monitor signs and symptoms reviewed when to seek urgent or emergent health care.      Relevant Medications   albuterol (VENTOLIN HFA) 108 (90 Base) MCG/ACT inhaler     Meds ordered this encounter  Medications   molnupiravir EUA (LAGEVRIO) 200 mg CAPS capsule    Sig: Take 4 capsules (800 mg total) by mouth 2 (two) times daily for 5 days.    Dispense:  40 capsule    Refill:  0    Order Specific Question:   Supervising Provider    Answer:   Roxy Manns A [[id number]]   albuterol (VENTOLIN HFA) 108 (90 Base) MCG/ACT inhaler    Sig: Inhale 2 puffs into the lungs every 6 (six) hours as needed for wheezing or shortness of breath.    Dispense:  8 g    Refill:  0    Order Specific Question:   Supervising Provider    Answer:    TOWER, MARNE A [[id number]]   No orders of the defined types were placed in this encounter.   I discussed the assessment and treatment plan with the patient. The patient was provided an opportunity to ask questions and all were answered. The patient agreed with the plan and demonstrated an understanding of the instructions. The patient was advised to call back or seek an in-person evaluation if the symptoms worsen or if the condition fails to improve as anticipated.  Follow up plan: No follow-ups on file.  Audria Nine, NP

## 2021-03-13 ENCOUNTER — Telehealth: Payer: BC Managed Care – PPO | Admitting: Family Medicine

## 2021-03-13 ENCOUNTER — Encounter: Payer: Self-pay | Admitting: Family Medicine

## 2021-03-13 DIAGNOSIS — F410 Panic disorder [episodic paroxysmal anxiety] without agoraphobia: Secondary | ICD-10-CM | POA: Diagnosis not present

## 2021-03-13 DIAGNOSIS — U071 COVID-19: Secondary | ICD-10-CM | POA: Diagnosis not present

## 2021-03-13 DIAGNOSIS — I10 Essential (primary) hypertension: Secondary | ICD-10-CM | POA: Diagnosis not present

## 2021-03-13 MED ORDER — HYDROCHLOROTHIAZIDE 25 MG PO TABS: 25.0000 mg | ORAL_TABLET | Freq: Every day | ORAL | 3 refills | Status: DC

## 2021-03-13 NOTE — Assessment & Plan Note (Signed)
Overall improving but slightly worsening congestion. Suspect some allergy component from moving. Allergy medication and decongestant. If worsening may consider sinus infection treatment

## 2021-03-13 NOTE — Assessment & Plan Note (Signed)
Significantly improved on Buspar 7.5 mg bid. Doing well with GAD down to 8. Discussed additional stress over the last 2 weeks with covid and moving. Given significant improvement will continue current dose but update in 2 weeks if wanting to try higher dose.

## 2021-03-13 NOTE — Progress Notes (Signed)
I connected with Lesleigh Noe on 03/13/21 at 10:20 AM EST by video and verified that I am speaking with the correct person using two identifiers.   I discussed the limitations, risks, security and privacy concerns of performing an evaluation and management service by video and the availability of in person appointments. I also discussed with the patient that there may be a patient responsible charge related to this service. The patient expressed understanding and agreed to proceed.  Patient location: Home Provider Location: [place] Participants: Lesleigh Noe and Tillman Abide   Subjective:     Sharon Kerr is a 46 y.o. female presenting for Follow-up (1 mo medication)     HPI  #Anxiety/panic attacks - started medication last night - has a lot of energy - feeling energized and happy - has noticed she is more blunt - taking buspar - is recovering from covid - took the antiviral medication and is feeling better - also moving - Still coughing and congested - no longer having panic attacks  #Covid - cough has become productive - taking inhaler at night - completed course of molnupiravir - took a lot of cough medication during covid - worried this morning due to worsening symptoms -    Review of Systems   Social History   Tobacco Use  Smoking Status Never  Smokeless Tobacco Never        Objective:   BP Readings from Last 3 Encounters:  02/06/21 115/89  04/17/20 130/80  01/08/20 128/76   Wt Readings from Last 3 Encounters:  02/06/21 (!) 330 lb (149.7 kg)  04/17/20 (!) 336 lb 12 oz (152.7 kg)  07/19/18 (!) 330 lb 9.6 oz (150 kg)   There were no vitals taken for this visit.  Physical Exam Constitutional:      Appearance: Normal appearance. She is not ill-appearing.  HENT:     Head: Normocephalic and atraumatic.     Right Ear: External ear normal.     Left Ear: External ear normal.  Eyes:     Conjunctiva/sclera: Conjunctivae  normal.  Pulmonary:     Effort: Pulmonary effort is normal. No respiratory distress.  Neurological:     Mental Status: She is alert. Mental status is at baseline.  Psychiatric:        Mood and Affect: Mood normal.        Behavior: Behavior normal.        Thought Content: Thought content normal.        Judgment: Judgment normal.     GAD 7 : Generalized Anxiety Score 03/13/2021 01/08/2020  Nervous, Anxious, on Edge 1 3  Control/stop worrying 1 3  Worry too much - different things 1 3  Trouble relaxing 1 3  Restless 2 3  Easily annoyed or irritable 2 3  Afraid - awful might happen 0 3  Total GAD 7 Score 8 21  Anxiety Difficulty Not difficult at all -    Depression screen [facility] 2/9 03/13/2021 04/17/2020 07/19/2018  Decreased Interest 0 1 1  Down, Depressed, Hopeless 0 1 1  PHQ - 2 Score 0 2 2  Altered sleeping 0 2 3  Tired, decreased energy 0 3 3  Change in appetite 1 1 3   Feeling bad or failure about yourself  0 1 3  Trouble concentrating 0 1 3  Moving slowly or fidgety/restless 1 0 0  Suicidal thoughts 0 0 0  PHQ-9 Score 2 10 17   Difficult doing work/chores Not  difficult at all Somewhat difficult Somewhat difficult          Assessment & Plan:   Problem List Items Addressed This Visit       Cardiovascular and Mediastinum   Essential hypertension, benign    BP controlled on last check. Due for labs in 1-2 months. Refill HCTZ 25 mg. Return for annual in 1-2 months      Relevant Medications   hydrochlorothiazide (HYDRODIURIL) 25 MG tablet     Other   Panic attacks - Primary (Chronic)    Significantly improved on Buspar 7.5 mg bid. Doing well with GAD down to 8. Discussed additional stress over the last 2 weeks with covid and moving. Given significant improvement will continue current dose but update in 2 weeks if wanting to try higher dose.       COVID-19    Overall improving but slightly worsening congestion. Suspect some allergy component from moving. Allergy  medication and decongestant. If worsening may consider sinus infection treatment      Other Visit Diagnoses     Essential hypertension       Relevant Medications   hydrochlorothiazide (HYDRODIURIL) 25 MG tablet        Return in about 4 weeks (around 04/10/2021) for annual.  Lesleigh Noe, MD

## 2021-03-13 NOTE — Assessment & Plan Note (Signed)
BP controlled on last check. Due for labs in 1-2 months. Refill HCTZ 25 mg. Return for annual in 1-2 months

## 2021-03-22 ENCOUNTER — Telehealth: Payer: Self-pay | Admitting: Family Medicine

## 2021-03-22 DIAGNOSIS — E119 Type 2 diabetes mellitus without complications: Secondary | ICD-10-CM

## 2021-04-01 NOTE — Telephone Encounter (Signed)
Sent my chart letter and lvm for pt to schedule cpe/lab

## 2021-04-09 NOTE — Telephone Encounter (Signed)
LVM for pt to call to schedule CPE  ?

## 2021-04-10 ENCOUNTER — Other Ambulatory Visit: Payer: Self-pay | Admitting: Family Medicine

## 2021-04-26 ENCOUNTER — Other Ambulatory Visit: Payer: Self-pay | Admitting: Family Medicine

## 2021-04-26 DIAGNOSIS — E119 Type 2 diabetes mellitus without complications: Secondary | ICD-10-CM

## 2021-06-10 ENCOUNTER — Encounter: Payer: BC Managed Care – PPO | Admitting: Family Medicine

## 2021-07-03 ENCOUNTER — Telehealth (INDEPENDENT_AMBULATORY_CARE_PROVIDER_SITE_OTHER): Payer: BC Managed Care – PPO | Admitting: Family Medicine

## 2021-07-03 ENCOUNTER — Encounter: Payer: Self-pay | Admitting: Family Medicine

## 2021-07-03 VITALS — Wt 320.0 lb

## 2021-07-03 DIAGNOSIS — J0140 Acute pansinusitis, unspecified: Secondary | ICD-10-CM

## 2021-07-03 MED ORDER — AMOXICILLIN-POT CLAVULANATE 875-125 MG PO TABS: 1.0000 | ORAL_TABLET | Freq: Two times a day (BID) | ORAL | 0 refills | Status: AC

## 2021-07-03 NOTE — Progress Notes (Signed)
    I connected with Peter Minium on 07/03/21 at  8:40 AM EDT by video and verified that I am speaking with the correct person using two identifiers.   I discussed the limitations, risks, security and privacy concerns of performing an evaluation and management service by video and the availability of in person appointments. I also discussed with the patient that there may be a patient responsible charge related to this service. The patient expressed understanding and agreed to proceed.  Patient location: Home Provider Location: [facility] Participants: Lynnda Child and Peter Minium   Subjective:     Sharon Kerr is a 46 y.o. female presenting for Nasal Congestion (X 1 month, but worsened over last week. Dark brown discharge.), Fatigue, Generalized Body Aches, and Cough     Cough Associated symptoms include chills, headaches, myalgias, postnasal drip and rhinorrhea. Pertinent negatives include no chest pain, ear pain, sore throat or shortness of breath.  Sinusitis This is a new problem. The current episode started 1 to 4 weeks ago. The problem is unchanged. There has been no fever. Associated symptoms include chills, congestion, coughing, headaches and sinus pressure. Pertinent negatives include no ear pain, shortness of breath or sore throat. Past treatments include oral decongestants (afrin). The treatment provided mild relief.    Review of Systems  Constitutional:  Positive for chills.  HENT:  Positive for congestion, postnasal drip, rhinorrhea and sinus pressure. Negative for ear pain and sore throat.   Respiratory:  Positive for cough. Negative for shortness of breath.   Cardiovascular:  Negative for chest pain.  Gastrointestinal:  Negative for diarrhea, nausea and vomiting.  Musculoskeletal:  Positive for myalgias.  Neurological:  Positive for headaches.    Social History   Tobacco Use  Smoking Status Never  Smokeless Tobacco Never         Objective:   BP Readings from Last 3 Encounters:  02/06/21 115/89  04/17/20 130/80  01/08/20 128/76   Wt Readings from Last 3 Encounters:  07/03/21 (!) 320 lb (145.2 kg)  02/06/21 (!) 330 lb (149.7 kg)  04/17/20 (!) 336 lb 12 oz (152.7 kg)    Wt (!) 320 lb (145.2 kg)   BMI 50.12 kg/m   Physical Exam Constitutional:      Appearance: Normal appearance. She is not ill-appearing.  HENT:     Head: Normocephalic and atraumatic.     Right Ear: External ear normal.     Left Ear: External ear normal.  Eyes:     Conjunctiva/sclera: Conjunctivae normal.  Pulmonary:     Effort: Pulmonary effort is normal. No respiratory distress.  Neurological:     Mental Status: She is alert. Mental status is at baseline.  Psychiatric:        Mood and Affect: Mood normal.        Behavior: Behavior normal.        Thought Content: Thought content normal.        Judgment: Judgment normal.           Assessment & Plan:   Problem List Items Addressed This Visit   None Visit Diagnoses     Acute non-recurrent pansinusitis    -  Primary   Relevant Medications   amoxicillin-clavulanate (AUGMENTIN) 875-125 MG tablet      Symptoms >7 days Neti pot and flonase Abx   Return if symptoms worsen or fail to improve.  Lynnda Child, MD

## 2021-07-03 NOTE — Patient Instructions (Signed)
Sinus Congestion 1) Neti Pot (Saline rinse) -- 2 times day -- if tolerated 2) Flonase (Store Brand ok) - once daily 3) Over the counter congestion medications 4) Can also try allergy medicine - Claritin, zyrtec, allegra (store brand OK)

## 2021-07-07 ENCOUNTER — Encounter: Payer: Self-pay | Admitting: Family Medicine

## 2021-07-08 ENCOUNTER — Ambulatory Visit (INDEPENDENT_AMBULATORY_CARE_PROVIDER_SITE_OTHER): Payer: BC Managed Care – PPO | Admitting: Family Medicine

## 2021-07-08 ENCOUNTER — Encounter: Payer: Self-pay | Admitting: Family Medicine

## 2021-07-08 VITALS — BP 140/60 | HR 90 | Temp 97.5°F | Ht 67.0 in

## 2021-07-08 DIAGNOSIS — J0140 Acute pansinusitis, unspecified: Secondary | ICD-10-CM

## 2021-07-08 DIAGNOSIS — I10 Essential (primary) hypertension: Secondary | ICD-10-CM | POA: Diagnosis not present

## 2021-07-08 DIAGNOSIS — E119 Type 2 diabetes mellitus without complications: Secondary | ICD-10-CM | POA: Diagnosis not present

## 2021-07-08 DIAGNOSIS — J301 Allergic rhinitis due to pollen: Secondary | ICD-10-CM

## 2021-07-08 DIAGNOSIS — E890 Postprocedural hypothyroidism: Secondary | ICD-10-CM | POA: Diagnosis not present

## 2021-07-08 LAB — CBC WITH DIFFERENTIAL/PLATELET
Basophils Absolute: 0.1 10*3/uL (ref 0.0–0.1)
Basophils Relative: 0.9 % (ref 0.0–3.0)
Eosinophils Absolute: 1.2 10*3/uL — ABNORMAL HIGH (ref 0.0–0.7)
Eosinophils Relative: 14.5 % — ABNORMAL HIGH (ref 0.0–5.0)
HCT: 42.7 % (ref 36.0–46.0)
Hemoglobin: 14.3 g/dL (ref 12.0–15.0)
Lymphocytes Relative: 34.8 % (ref 12.0–46.0)
Lymphs Abs: 3 10*3/uL (ref 0.7–4.0)
MCHC: 33.4 g/dL (ref 30.0–36.0)
MCV: 88 fl (ref 78.0–100.0)
Monocytes Absolute: 0.5 10*3/uL (ref 0.1–1.0)
Monocytes Relative: 5.4 % (ref 3.0–12.0)
Neutro Abs: 3.8 10*3/uL (ref 1.4–7.7)
Neutrophils Relative %: 44.4 % (ref 43.0–77.0)
Platelets: 247 10*3/uL (ref 150.0–400.0)
RBC: 4.86 Mil/uL (ref 3.87–5.11)
RDW: 13.7 % (ref 11.5–15.5)
WBC: 8.6 10*3/uL (ref 4.0–10.5)

## 2021-07-08 LAB — LIPID PANEL
Cholesterol: 108 mg/dL (ref 0–200)
HDL: 31.8 mg/dL — ABNORMAL LOW (ref >39.00)
LDL Cholesterol: 55 mg/dL (ref 0–99)
NonHDL: 75.73
Total CHOL/HDL Ratio: 3
Triglycerides: 106 mg/dL (ref 0.0–149.0)
VLDL: 21.2 mg/dL (ref 0.0–40.0)

## 2021-07-08 LAB — COMPREHENSIVE METABOLIC PANEL
ALT: 34 U/L (ref 0–35)
AST: 18 U/L (ref 0–37)
Albumin: 4 g/dL (ref 3.5–5.2)
Alkaline Phosphatase: 46 U/L (ref 39–117)
BUN: 19 mg/dL (ref 6–23)
CO2: 28 mEq/L (ref 19–32)
Calcium: 9.5 mg/dL (ref 8.4–10.5)
Chloride: 102 mEq/L (ref 96–112)
Creatinine, Ser: 0.58 mg/dL (ref 0.40–1.20)
GFR: 108.72 mL/min (ref >60.00)
Glucose, Bld: 125 mg/dL — ABNORMAL HIGH (ref 70–99)
Potassium: 3.8 mEq/L (ref 3.5–5.1)
Sodium: 139 mEq/L (ref 135–145)
Total Bilirubin: 1.4 mg/dL — ABNORMAL HIGH (ref 0.2–1.2)
Total Protein: 6.3 g/dL (ref 6.0–8.3)

## 2021-07-08 LAB — POCT GLYCOSYLATED HEMOGLOBIN (HGB A1C): Hemoglobin A1C: 6.4 % — AB (ref 4.0–5.6)

## 2021-07-08 LAB — TSH: TSH: 0.82 u[IU]/mL (ref 0.35–5.50)

## 2021-07-08 NOTE — Assessment & Plan Note (Signed)
Lab Results  Component Value Date   HGBA1C 6.4 (A) 07/08/2021   Well controlled. Cont healthy diet. Cont metformin 500 mg daily

## 2021-07-08 NOTE — Assessment & Plan Note (Signed)
Suspect some allergy component to her sinus infection given lack of improvement. Saline rinse, flonase, Claritin, and pseudoephedrine.

## 2021-07-08 NOTE — Assessment & Plan Note (Signed)
Stable. slightly high in the office. Cont hctz 25 mg. Labs today

## 2021-07-08 NOTE — Progress Notes (Signed)
Subjective:     Viktoriya Glaspy is a 46 y.o. female presenting for Nasal Congestion, Sinusitis (Dark brown mucous ), and Fatigue     Sinusitis This is a new problem. There has been no fever. Associated symptoms include congestion, coughing, ear pain and sinus pressure. Pertinent negatives include no chills, shortness of breath, sneezing, sore throat or swollen glands. Past treatments include antibiotics (afrin). The treatment provided moderate relief.   Not taking any medication Took claritin w/o response    Sick for almost 1 month   Review of Systems  Constitutional:  Negative for chills.  HENT:  Positive for congestion, ear pain and sinus pressure. Negative for sneezing and sore throat.   Respiratory:  Positive for cough. Negative for shortness of breath.     Social History   Tobacco Use  Smoking Status Never  Smokeless Tobacco Never        Objective:    BP Readings from Last 3 Encounters:  07/08/21 140/60  02/06/21 115/89  04/17/20 130/80   Wt Readings from Last 3 Encounters:  07/03/21 (!) 320 lb (145.2 kg)  02/06/21 (!) 330 lb (149.7 kg)  04/17/20 (!) 336 lb 12 oz (152.7 kg)    BP 140/60   Pulse 90   Temp (!) 97.5 F (36.4 C) (Temporal)   Ht 5\' 7"  (1.702 m)   SpO2 97%   BMI 50.12 kg/m    Physical Exam Constitutional:      General: She is not in acute distress.    Appearance: She is well-developed. She is not diaphoretic.  HENT:     Head: Normocephalic and atraumatic.     Right Ear: Tympanic membrane and ear canal normal.     Left Ear: Tympanic membrane and ear canal normal.     Nose: Mucosal edema and rhinorrhea present.     Right Sinus: No maxillary sinus tenderness or frontal sinus tenderness.     Left Sinus: No maxillary sinus tenderness or frontal sinus tenderness.     Mouth/Throat:     Pharynx: Uvula midline. Posterior oropharyngeal erythema present. No oropharyngeal exudate.     Tonsils: 0 on the right. 0 on the left.  Eyes:      General: No scleral icterus.    Conjunctiva/sclera: Conjunctivae normal.  Cardiovascular:     Rate and Rhythm: Normal rate and regular rhythm.     Heart sounds: Normal heart sounds. No murmur heard. Pulmonary:     Effort: Pulmonary effort is normal. No respiratory distress.     Breath sounds: Normal breath sounds.  Musculoskeletal:     Cervical back: Neck supple.  Lymphadenopathy:     Cervical: No cervical adenopathy.  Skin:    General: Skin is warm and dry.     Capillary Refill: Capillary refill takes less than 2 seconds.  Neurological:     Mental Status: She is alert.          Assessment & Plan:   Problem List Items Addressed This Visit       Cardiovascular and Mediastinum   Essential hypertension, benign    Stable. slightly high in the office. Cont hctz 25 mg. Labs today       Relevant Orders   Comprehensive metabolic panel   CBC with Differential   Lipid panel     Respiratory   Allergic rhinitis due to pollen    Suspect some allergy component to her sinus infection given lack of improvement. Saline rinse, flonase, Claritin, and pseudoephedrine.  Endocrine   Hypothyroidism associated with surgical procedure (Chronic)    Recheck. Stable on levo 50 mcg but with some fatigue in setting of infection       Relevant Orders   TSH   Type 2 diabetes mellitus without complication, without long-term current use of insulin (HCC) - Primary    Lab Results  Component Value Date   HGBA1C 6.4 (A) 07/08/2021  Well controlled. Cont healthy diet. Cont metformin 500 mg daily      Relevant Orders   POCT glycosylated hemoglobin (Hb A1C) (Completed)   Lipid panel     Other   Morbid obesity (HCC)    Working on weight loss. Healthy eating. Labs today       Relevant Orders   Comprehensive metabolic panel   CBC with Differential   Lipid panel   Other Visit Diagnoses     Acute non-recurrent pansinusitis          Cont Augmentin. Treat for allergy  component. If no improvement will add Doxycycline  Return if symptoms worsen or fail to improve.  Lynnda Child, MD

## 2021-07-08 NOTE — Assessment & Plan Note (Signed)
Working on weight loss. Healthy eating. Labs today

## 2021-07-08 NOTE — Patient Instructions (Addendum)
  Drink plenty of fluids Get lots of rest  Sinus Congestion 1) Neti Pot (Saline rinse) -- 2 times day -- if tolerated 2) Flonase (Store Brand ok) - once daily 3) Try pseudoephedrine 4) daily allergy pill   Cough 1) Cough drops can be helpful 2) Nyquil (or nighttime cough medication) 3) Honey is proven to be one of the best cough medications  4) Cough medicine with Dextromethorphan can also be helpful

## 2021-07-08 NOTE — Assessment & Plan Note (Signed)
Recheck. Stable on levo 50 mcg but with some fatigue in setting of infection

## 2021-07-10 ENCOUNTER — Encounter: Payer: Self-pay | Admitting: Family Medicine

## 2021-07-22 ENCOUNTER — Encounter: Payer: Self-pay | Admitting: Family Medicine

## 2021-07-22 DIAGNOSIS — R0981 Nasal congestion: Secondary | ICD-10-CM

## 2021-07-22 MED ORDER — PREDNISONE 20 MG PO TABS: 40.0000 mg | ORAL_TABLET | Freq: Every day | ORAL | 0 refills | Status: AC

## 2021-07-22 MED ORDER — DOXYCYCLINE HYCLATE 100 MG PO TABS: 100.0000 mg | ORAL_TABLET | Freq: Two times a day (BID) | ORAL | 0 refills | Status: AC

## 2021-07-28 ENCOUNTER — Encounter: Payer: BC Managed Care – PPO | Admitting: Family Medicine

## 2021-07-29 ENCOUNTER — Encounter: Payer: Self-pay | Admitting: Family Medicine

## 2021-07-29 ENCOUNTER — Other Ambulatory Visit: Payer: Self-pay | Admitting: Family Medicine

## 2021-07-29 DIAGNOSIS — E119 Type 2 diabetes mellitus without complications: Secondary | ICD-10-CM

## 2021-08-19 ENCOUNTER — Encounter: Payer: BC Managed Care – PPO | Admitting: Family Medicine

## 2021-09-03 ENCOUNTER — Telehealth: Payer: Self-pay | Admitting: Family Medicine

## 2021-09-03 NOTE — Telephone Encounter (Signed)
Patient called in stating that she is aware of Dr Selena Batten leaving soon,and  she would prefer Dr Peter Garter she's going to be accepting new patients.

## 2021-11-05 ENCOUNTER — Ambulatory Visit: Payer: BC Managed Care – PPO | Admitting: Family Medicine

## 2021-11-11 ENCOUNTER — Ambulatory Visit: Payer: BC Managed Care – PPO | Admitting: Family Medicine

## 2021-11-11 ENCOUNTER — Encounter: Payer: Self-pay | Admitting: Family Medicine

## 2021-11-11 VITALS — BP 146/80 | HR 90 | Temp 97.2°F | Ht 67.0 in

## 2021-11-11 DIAGNOSIS — R0681 Apnea, not elsewhere classified: Secondary | ICD-10-CM | POA: Diagnosis not present

## 2021-11-11 DIAGNOSIS — M25551 Pain in right hip: Secondary | ICD-10-CM | POA: Insufficient documentation

## 2021-11-11 DIAGNOSIS — R0683 Snoring: Secondary | ICD-10-CM | POA: Insufficient documentation

## 2021-11-11 NOTE — Assessment & Plan Note (Signed)
Snoring, day time somnolence and witnessed pm apnea in morbidly obese pt  Record reviewed Also hypothyroid and pcos and DM, difficult time with wt control  Disc poss sleep apnea and its impact on energy, mood, concentration and inc risk of vasc events in some people  She is actively working on wt loss (down 10 lb) Ref done to pulmonary for eval /tx Handout given

## 2021-11-11 NOTE — Progress Notes (Unsigned)
Subjective:    Patient ID: Sharon Kerr, female    DOB: [birth year], 46 y.o.   MRN: [id number]  HPI Pt presents to discuss possible sleep apnea  Also mentions hip pain at end of visit  46 yo pr of Dr Selena Batten She has a h/o hypothyroidism and PCOS and DM2  Wt Readings from Last 3 Encounters:  07/03/21 (!) 320 lb (145.2 kg)  02/06/21 (!) 330 lb (149.7 kg)  04/17/20 (!) 336 lb 12 oz (152.7 kg)   50.12 kg/m  Thinks she has sleep apnea   She snores  Wakes up gasping and choking  Tired all the time  Wakes up frequently  Poor sleep   Her husband noted periods of apnea/ does not breathe and has to nudge her   Nods off easily  Can fall asleep anywhere   She had covid last December  Has had a lot of sinus infections since then  [facility]- Dr Jenne Campus - was put on abx and steroids  Abn CT of sinuses/had a polyp    Patient Active Problem List   Diagnosis Date Noted   Snoring 11/11/2021   Apnea 11/11/2021   COVID-19 03/04/2021   Wheezing 03/04/2021   Type 2 diabetes mellitus without complication, without long-term current use of insulin (HCC) 01/11/2020   Allergic rhinitis due to animal hair and dander 01/11/2020   Essential hypertension, benign 02/07/2016   PCOS (polycystic ovarian syndrome) 06/26/2014   Hypothyroidism associated with surgical procedure    Social anxiety disorder 12/16/2012   Panic attacks 12/16/2012   Allergic rhinitis due to pollen    Thyroid nodule 07/08/2011   Morbid obesity (HCC) 07/08/2011   Aortic regurgitation 07/06/2011   ACUTE GOUTY ARTHROPATHY 10/25/2009   Asthma 11/23/2007   Past Medical History:  Diagnosis Date   Allergic rhinitis due to pollen    Aortic regurgitation 07/06/2011   Asthma    History of high blood pressure    without diag   Hypothyroidism associated with surgical procedure    Migraine    Panic attacks 12/16/2012   PCOS (polycystic ovarian syndrome) 06/26/2014   Social anxiety disorder 12/16/2012   Past Surgical History:   Procedure Laterality Date   BREAST ENHANCEMENT SURGERY  1999   CESAREAN SECTION     FOOT SURGERY  2001   LEEP     THYROIDECTOMY, PARTIAL  2009   [facility]   Social History   Tobacco Use   Smoking status: Never   Smokeless tobacco: Never  Vaping Use   Vaping Use: Never used  Substance Use Topics   Alcohol use: No    Alcohol/week: 0.0 standard drinks of alcohol   Drug use: No   Family History  Problem Relation Age of Onset   Breast cancer Mother 23   Allergies  Allergen Reactions   Oxycodone Itching and Rash    Other Reaction: Red all over   Codeine    Current Outpatient Medications on File Prior to Visit  Medication Sig Dispense Refill   albuterol (VENTOLIN HFA) 108 (90 Base) MCG/ACT inhaler Inhale 2 puffs into the lungs every 6 (six) hours as needed for wheezing or shortness of breath. 8 g 0   ARMOUR THYROID 60 MG tablet Take 60 mg by mouth daily.     atorvastatin (LIPITOR) 20 MG tablet TAKE 1 TABLET (20 MG TOTAL) BY MOUTH DAILY. MUST SEE PCP FOR FURTHER REFILLS. 90 tablet 3   busPIRone (BUSPAR) 15 MG tablet TAKE 1/2 TABLET (7.5 MG TOTAL) BY  MOUTH TWICE A DAY 90 tablet 2   fluticasone (FLONASE) 50 MCG/ACT nasal spray Place 2 sprays into both nostrils daily. (Patient taking differently: Place 2 sprays into both nostrils daily as needed.) 16 g 6   hydrochlorothiazide (HYDRODIURIL) 25 MG tablet Take 1 tablet (25 mg total) by mouth daily. 90 tablet 3   medroxyPROGESTERone (PROVERA) 10 MG tablet Take 10 mg by mouth daily as needed.     metFORMIN (GLUCOPHAGE-XR) 500 MG 24 hr tablet Take 1 tablet (500 mg total) by mouth daily with breakfast. 90 tablet 3   triamcinolone cream (KENALOG) 0.1 % Apply 1 application topically 2 (two) times daily. To affected areas of psoriasis 30 g 0   No current facility-administered medications on file prior to visit.    Review of Systems  Constitutional:  Positive for fatigue. Negative for activity change, appetite change, fever and unexpected weight  change.  HENT:  Negative for congestion, ear pain, rhinorrhea, sinus pressure and sore throat.   Eyes:  Negative for pain, redness and visual disturbance.  Respiratory:  Positive for apnea. Negative for cough, shortness of breath and wheezing.   Cardiovascular:  Negative for chest pain, palpitations and leg swelling.  Gastrointestinal:  Negative for abdominal pain, blood in stool, constipation and diarrhea.  Endocrine: Negative for polydipsia and polyuria.  Genitourinary:  Negative for dysuria, frequency and urgency.  Musculoskeletal:  Positive for arthralgias. Negative for back pain and myalgias.  Skin:  Negative for pallor and rash.  Allergic/Immunologic: Negative for environmental allergies.  Neurological:  Negative for dizziness, syncope and headaches.  Hematological:  Negative for adenopathy. Does not bruise/bleed easily.  Psychiatric/Behavioral:  Negative for decreased concentration and dysphoric mood. The patient is not nervous/anxious.        Objective:   Physical Exam Constitutional:      General: She is not in acute distress.    Appearance: Normal appearance. She is well-developed. She is obese. She is not ill-appearing or diaphoretic.  HENT:     Head: Normocephalic and atraumatic.  Eyes:     General: No scleral icterus.    Conjunctiva/sclera: Conjunctivae normal.     Pupils: Pupils are equal, round, and reactive to light.  Neck:     Thyroid: No thyromegaly.     Vascular: No carotid bruit or JVD.  Cardiovascular:     Rate and Rhythm: Normal rate and regular rhythm.     Heart sounds: Normal heart sounds.     No gallop.  Pulmonary:     Effort: Pulmonary effort is normal. No respiratory distress.     Breath sounds: Normal breath sounds. No stridor. No wheezing, rhonchi or rales.  Abdominal:     General: There is no distension or abdominal bruit.     Palpations: Abdomen is soft. There is no mass.     Tenderness: There is no abdominal tenderness.  Musculoskeletal:      Cervical back: Normal range of motion and neck supple.     Right lower leg: No edema.     Left lower leg: No edema.     Comments: Pain with active flexion of R hip (not passive) Nl int/ext rotation No tenderness Nl gait   Lymphadenopathy:     Cervical: No cervical adenopathy.  Skin:    General: Skin is warm and dry.     Coloration: Skin is not pale.     Findings: No rash.  Neurological:     Mental Status: She is alert.     Sensory: No  sensory deficit.     Motor: No weakness.     Coordination: Coordination normal.     Gait: Gait normal.     Deep Tendon Reflexes: Reflexes are normal and symmetric. Reflexes normal.  Psychiatric:        Mood and Affect: Mood normal.           Assessment & Plan:   Problem List Items Addressed This Visit       Other   Apnea    Witnessed by husb at night with snoring  Suspect OSA      Relevant Orders   Ambulatory referral to Pulmonology   Hip pain, right    Struggling with pain when flexing hip  Anterior  Nl rotation today and passive flexion is ok  Active hip flexion is painful (? If this is a hip flexor injury)  She does cross fit  inst to hold off on movements that hurt  Ref to dr Lorelei Pont for eval/tx       Snoring - Primary    Snoring, day time somnolence and witnessed pm apnea in morbidly obese pt  Record reviewed Also hypothyroid and pcos and DM, difficult time with wt control  Disc poss sleep apnea and its impact on energy, mood, concentration and inc risk of vasc events in some people  She is actively working on wt loss (down 10 lb) Ref done to pulmonary for eval /tx Handout given       Relevant Orders   Ambulatory referral to Pulmonology

## 2021-11-11 NOTE — Assessment & Plan Note (Signed)
Struggling with pain when flexing hip  Anterior  Nl rotation today and passive flexion is ok  Active hip flexion is painful (? If this is a hip flexor injury)  She does cross fit  inst to hold off on movements that hurt  Ref to dr Lorelei Pont for eval/tx

## 2021-11-11 NOTE — Patient Instructions (Addendum)
Stop up front to schedule an appt with Dr Lorelei Pont for the hip pain   I will refer you to pulmonary for sleep apnea evaluation  If you don't get a call in 1-2 weeks please let us know   Keep working on weight loss Healthy diet  Exercise as tolerated   If a movement hurts - hold off until evaluation  Use ice also

## 2021-11-11 NOTE — Assessment & Plan Note (Signed)
Witnessed by husb at night with snoring  Suspect OSA

## 2021-11-19 ENCOUNTER — Encounter: Payer: Self-pay | Admitting: Family Medicine

## 2021-11-19 ENCOUNTER — Ambulatory Visit: Payer: BC Managed Care – PPO | Admitting: Family Medicine

## 2021-11-19 ENCOUNTER — Ambulatory Visit (INDEPENDENT_AMBULATORY_CARE_PROVIDER_SITE_OTHER)
Admission: RE | Admit: 2021-11-19 | Discharge: 2021-11-19 | Disposition: A | Discharge status: Home / Self Care | Payer: BC Managed Care – PPO | Source: Ambulatory Visit | Attending: Family Medicine | Admitting: Family Medicine

## 2021-11-19 VITALS — BP 120/84 | HR 98 | Temp 98.3°F | Ht 67.0 in

## 2021-11-19 DIAGNOSIS — M545 Low back pain, unspecified: Secondary | ICD-10-CM

## 2021-11-19 DIAGNOSIS — M25551 Pain in right hip: Secondary | ICD-10-CM | POA: Diagnosis not present

## 2021-11-19 DIAGNOSIS — M25561 Pain in right knee: Secondary | ICD-10-CM | POA: Diagnosis not present

## 2021-11-19 MED ORDER — DICLOFENAC SODIUM 75 MG PO TBEC: 75.0000 mg | DELAYED_RELEASE_TABLET | Freq: Two times a day (BID) | ORAL | 3 refills | Status: DC

## 2021-11-19 NOTE — Progress Notes (Signed)
Sharon Sainsbury T. Sharon Mohamad, MD, [place] [facility] [place], [address]  Phone: [phone]  FAX: [phone]  Sharon Kerr - 46 y.o. female  MRN [id number]  Date of Birth: 29-Jul-1975  Date: 11/19/2021  PCP: Abner Greenspan, MD  Referral: Waunita Schooner, MD  Chief Complaint  Patient presents with   Hip Pain    Bilateral but Right is worse-Recently starting doing cross-fit recently   Back Pain    Lower   Knee Pain   Subjective:   Sharon Kerr is a 46 y.o. very pleasant female patient with Body mass index is 50.12 kg/m. who presents with the following:  Pleasant patient presents with hip pain.  On the right side.  For a couple of years, has had some pain in her lower back and in the hip joints.  Enjoying cross-fit.  Then feels broken when leaves.  She has been doing CrossFit for roughly 2 months.  They are able to have her do some meditations with the movements.  Bowing with knees on a rowing machine.  She has had some more medial compartmental right-sided knee pain. Mostly pain in the hips and in the lower back.   Quads feels like they are burning - has been doing cross-fit for 2 months.  She feels quadricep burning after her workouts.  She denies any numbness, tingling, or radicular pain down the legs.  No incontinence.  No numbness or any kind of strength changes bilateral lower extremities.  Review of Systems is noted in the HPI, as appropriate  Objective:   BP 120/84   Pulse 98   Temp 98.3 F (36.8 C) (Oral)   Ht 5\' 7"  (1.702 m)   LMP 09/29/2021   SpO2 96%   BMI 50.12 kg/m   GEN: No acute distress; alert,appropriate. PULM: Breathing comfortably in no respiratory distress PSYCH: Normally interactive.   She has tenderness in the erector spinae complex from L2-S1 bilaterally. Full range of motion at the waist with forward flexion, extension as well as lateral bending and rotational  maneuvers.   Range of motion at  the waist: Flexion: normal Extension: normal Lateral bending: normal Rotation: all normal  No echymosis or edema Rises to examination table with no difficulty Gait: non antalgic  Inspection/Deformity: N Lordotic  B Ankle Dorsiflexion (L5,4): 5/5 B Great Toe Dorsiflexion (L5,4): 5/5 Dorsiflexion and plantarflexion are 5/5 Rise/Squat (L4): WNL  SENSORY B Medial Foot (L4): WNL B Dorsum (L5): WNL B Lateral (S1): WNL Light Touch: WNL  B SLR, seated: neg B SLR, supine: neg B FABER: neg B Reverse FABER: neg B Greater Troch: NT B Log Roll: neg B Sciatic Notch: NT    HIP EXAM: SIDE: Bilateral ROM: Abduction, Flexion, Internal and External range of motion: Full Pain with terminal IROM and EROM: Right side with terminal internal range of motion pain Piriformis: NT at direct palpation Str: flexion: 5/5 abduction: 5/5 adduction: 5/5 Strength testing non-tender   Right knee: Full extension.  Flexion to 120.  Stable to varus and valgus stress.  No pain with loading the medial and lateral patellar facets.  She does have some medial joint line tenderness.  No significant lateral joint line tenderness.  No pain with bounce home, flexion pinch or any McMurray's.  No mechanical symptoms.  Laboratory and Imaging Data:  Assessment and Plan:     ICD-10-CM   1. Acute right hip pain  M25.551 DG Hip Unilat W OR W/O  Pelvis 2-3 Views Right    2. Acute bilateral low back pain without sciatica  M54.50     3. Acute pain of right knee  M25.561      Acute right anterior groin pain.  This is mostly hip irritation from overuse from CrossFit, but on her plain films she does have to have some very mild superior degenerative changes.  Suspect that this also plays a role.  Acute on chronic back pain, likely exacerbated from changes in exercise patterns.  Think of weight probably is playing a role for both a, as well.  Right knee with some minor irritation,  medial compartmental.  Recommend she take the next or 5 days off from her CrossFit activities, and start some oral Voltaren scheduled over the next week to 10 days.  Medication Management during today's office visit: Meds ordered this encounter  Medications   diclofenac (VOLTAREN) 75 MG EC tablet    Sig: Take 1 tablet (75 mg total) by mouth 2 (two) times daily.    Dispense:  60 tablet    Refill:  3   Medications Discontinued During This Encounter  Medication Reason   fluticasone (FLONASE) 50 MCG/ACT nasal spray Change in therapy    Orders placed today for conditions managed today: Orders Placed This Encounter  Procedures   DG Hip Unilat W OR W/O Pelvis 2-3 Views Right    Disposition: No follow-ups on file.  Dragon [employer] One speech-to-text software was used for transcription in this dictation.  Possible transcriptional errors can occur using Animal nutritionist.   Signed,  Elpidio Galea. Cathan Gearin, MD   Outpatient Encounter Medications as of 11/19/2021  Medication Sig   albuterol (VENTOLIN HFA) 108 (90 Base) MCG/ACT inhaler Inhale 2 puffs into the lungs every 6 (six) hours as needed for wheezing or shortness of breath.   ARMOUR THYROID 60 MG tablet Take 60 mg by mouth daily.   atorvastatin (LIPITOR) 20 MG tablet TAKE 1 TABLET (20 MG TOTAL) BY MOUTH DAILY. MUST SEE PCP FOR FURTHER REFILLS.   busPIRone (BUSPAR) 15 MG tablet TAKE 1/2 TABLET (7.5 MG TOTAL) BY MOUTH TWICE A DAY   diclofenac (VOLTAREN) 75 MG EC tablet Take 1 tablet (75 mg total) by mouth 2 (two) times daily.   hydrochlorothiazide (HYDRODIURIL) 25 MG tablet Take 1 tablet (25 mg total) by mouth daily.   medroxyPROGESTERone (PROVERA) 10 MG tablet Take 10 mg by mouth daily as needed.   metFORMIN (GLUCOPHAGE-XR) 500 MG 24 hr tablet Take 1 tablet (500 mg total) by mouth daily with breakfast.   triamcinolone cream (KENALOG) 0.1 % Apply 1 application topically 2 (two) times daily. To affected areas of psoriasis   [DISCONTINUED]  fluticasone (FLONASE) 50 MCG/ACT nasal spray Place 2 sprays into both nostrils daily. (Patient taking differently: Place 2 sprays into both nostrils daily as needed.)   No facility-administered encounter medications on file as of 11/19/2021.

## 2021-12-01 ENCOUNTER — Ambulatory Visit (INDEPENDENT_AMBULATORY_CARE_PROVIDER_SITE_OTHER): Payer: BC Managed Care – PPO | Admitting: Pulmonary Disease

## 2021-12-01 ENCOUNTER — Encounter: Payer: Self-pay | Admitting: Pulmonary Disease

## 2021-12-01 VITALS — BP 120/78 | HR 100 | Temp 98.1°F

## 2021-12-01 DIAGNOSIS — R0683 Snoring: Secondary | ICD-10-CM | POA: Diagnosis not present

## 2021-12-01 NOTE — Progress Notes (Unsigned)
Sharon Kerr    [id number]    08-23-1975  Primary Care Physician:Tower, Audrie Gallus, MD  Referring Physician: Tower, Audrie Gallus, MD [address] [place],  [place] [address]  Chief complaint:   Evaluation for sleep apnea  HPI:  History of snoring, witnessed apneas, choking episodes when trying to go to sleep  Usually goes to bed about 11 to 12 AM, takes about 15 minutes to fall asleep About 2 awakenings Final wake up time about 430  Dryness of her mouth, choking episodes No morning headaches  Dad does snore  Weight is down about 25 pounds  Never smoker  History of hypertension, asthma, diabetes, hyperlipidemia sinus problems  Chronic sinus fullness congestion Did have COVID in December, has had significant nasal stuffiness since then  Evaluated by ENT  Outpatient Encounter Medications as of 12/01/2021  Medication Sig   albuterol (VENTOLIN HFA) 108 (90 Base) MCG/ACT inhaler Inhale 2 puffs into the lungs every 6 (six) hours as needed for wheezing or shortness of breath.   ARMOUR THYROID 60 MG tablet Take 60 mg by mouth daily.   atorvastatin (LIPITOR) 20 MG tablet TAKE 1 TABLET (20 MG TOTAL) BY MOUTH DAILY. MUST SEE PCP FOR FURTHER REFILLS.   busPIRone (BUSPAR) 15 MG tablet TAKE 1/2 TABLET (7.5 MG TOTAL) BY MOUTH TWICE A DAY   diclofenac (VOLTAREN) 75 MG EC tablet Take 1 tablet (75 mg total) by mouth 2 (two) times daily.   hydrochlorothiazide (HYDRODIURIL) 25 MG tablet Take 1 tablet (25 mg total) by mouth daily.   medroxyPROGESTERone (PROVERA) 10 MG tablet Take 10 mg by mouth daily as needed.   metFORMIN (GLUCOPHAGE-XR) 500 MG 24 hr tablet Take 1 tablet (500 mg total) by mouth daily with breakfast.   triamcinolone cream (KENALOG) 0.1 % Apply 1 application topically 2 (two) times daily. To affected areas of psoriasis   No facility-administered encounter medications on file as of 12/01/2021.    Allergies as of 12/01/2021 - Review Complete 12/01/2021   Allergen Reaction Noted   Oxycodone Itching and Rash 06/25/2014   Codeine  11/23/2007    Past Medical History:  Diagnosis Date   Allergic rhinitis due to pollen    Aortic regurgitation 07/06/2011   Asthma    History of high blood pressure    without diag   Hypothyroidism associated with surgical procedure    Migraine    Panic attacks 12/16/2012   PCOS (polycystic ovarian syndrome) 06/26/2014   Social anxiety disorder 12/16/2012    Past Surgical History:  Procedure Laterality Date   BREAST ENHANCEMENT SURGERY  1999   CESAREAN SECTION     FOOT SURGERY  2001   LEEP     THYROIDECTOMY, PARTIAL  2009   [facility]    Family History  Problem Relation Age of Onset   Breast cancer Mother 56    Social History   Socioeconomic History   Marital status: Married    Spouse name: Jimmy   Number of children: 1   Years of education: some college   Highest education level: Not on file  Occupational History   Occupation: Biomedical scientist  Tobacco Use   Smoking status: Never   Smokeless tobacco: Never  Vaping Use   Vaping Use: Never used  Substance and Sexual Activity   Alcohol use: No    Alcohol/week: 0.0 standard drinks of alcohol   Drug use: No   Sexual activity: Yes    Birth control/protection: None,  Abstinence  Other Topics Concern   Not on file  Social History Narrative   04/17/20   From: [place] moved for husband   Living: with Laverna Peace, husband (2001) and Linton Rump (2009)   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  Respiratory:  Negative for shortness of breath.   Psychiatric/Behavioral:  Positive for sleep disturbance.     Vitals:   12/01/21 1347  BP: 120/78  Pulse: 100  Temp: 98.1 F (36.7 C)  SpO2: 97%     Physical Exam Constitutional:      Appearance: She is obese.  HENT:     Head: Normocephalic.     Mouth/Throat:     Mouth: Mucous membranes are moist.  Cardiovascular:     Rate and Rhythm: Normal rate and regular rhythm.     Heart sounds: No murmur heard.    No friction rub.  Pulmonary:     Effort: No respiratory distress.     Breath sounds: No stridor. No wheezing or rhonchi.  Musculoskeletal:     Cervical back: No rigidity or tenderness.  Neurological:     Mental Status: She is alert.  Psychiatric:        Mood and Affect: Mood normal.       12/01/2021    1:00 PM  Results of the Epworth flowsheet  Sitting and reading 3  Watching TV 3  Sitting, inactive in a public place (e.g. a theatre or a meeting) 0  As a passenger in a car for an hour without a break 3  Lying down to rest in the afternoon when circumstances permit 0  Sitting and talking to someone 0  Sitting quietly after a lunch without alcohol 3  In a car, while stopped for a few minutes in traffic 3  Total score 15     Data Reviewed: No previous sleep study on record  Assessment:  Excessive daytime sleepiness  Witnessed apneas  High-protein of significant obstructive sleep apnea  Pathophysiology of sleep disordered breathing discussed with the patient  Treatment options discussed  Plan/Recommendations: Schedule for home sleep study  Encourage weight loss efforts  Graded exercise as tolerated  Continue management for nasal stuffiness and congestion  Tentative follow-up in about 3 months  Encouraged to call with significant concerns   Sherrilyn Rist MD [facility] 12/01/2021, 2:24 PM  CC: Tower, Wynelle Fanny, MD

## 2021-12-01 NOTE — Patient Instructions (Signed)
Schedule for home sleep study  Continue weight loss efforts  Graded exercise as tolerated  Try and get 6 to 8 hours of sleep every night  Call with significant concerns  Living With Sleep Apnea Sleep apnea is a condition in which breathing pauses or becomes shallow during sleep. Sleep apnea is most commonly caused by a collapsed or blocked airway. People with sleep apnea usually snore loudly. They may have times when they gasp and stop breathing for 10 seconds or more during sleep. This may happen many times during the night. The breaks in breathing also interrupt the deep sleep that you need to feel rested. Even if you do not completely wake up from the gaps in breathing, your sleep may not be restful and you feel tired during the day. You may also have a headache in the morning and low energy during the day, and you may feel anxious or depressed. How can sleep apnea affect me? Sleep apnea increases your chances of extreme tiredness during the day (daytime fatigue). It can also increase your risk for health conditions, such as: Heart attack. Stroke. Obesity. Type 2 diabetes. Heart failure. Irregular heartbeat. High blood pressure. If you have daytime fatigue as a result of sleep apnea, you may be more likely to: Perform poorly at school or work. Fall asleep while driving. Have difficulty with attention. Develop depression or anxiety. Have sexual dysfunction. What actions can I take to manage sleep apnea? Sleep apnea treatment  If you were given a device to open your airway while you sleep, use it only as told by your health care provider. You may be given: An oral appliance. This is a custom-made mouthpiece that shifts your lower jaw forward. A continuous positive airway pressure (CPAP) device. This device blows air through a mask when you breathe out (exhale). A nasal expiratory positive airway pressure (EPAP) device. This device has valves that you put into each nostril. A  bi-level positive airway pressure (BIPAP) device. This device blows air through a mask when you breathe in (inhale) and breathe out (exhale). You may need surgery if other treatments do not work for you. Sleep habits Go to sleep and wake up at the same time every day. This helps set your internal clock (circadian rhythm) for sleeping. If you stay up later than usual, such as on weekends, try to get up in the morning within 2 hours of your normal wake time. Try to get at least 7-9 hours of sleep each night. Stop using a computer, tablet, and mobile phone a few hours before bedtime. Do not take long naps during the day. If you nap, limit it to 30 minutes. Have a relaxing bedtime routine. Reading or listening to music may relax you and help you sleep. Use your bedroom only for sleep. Keep your television and computer out of your bedroom. Keep your bedroom cool, dark, and quiet. Use a supportive mattress and pillows. Follow your health care provider's instructions for other changes to sleep habits. Nutrition Do not eat heavy meals in the evening. Do not have caffeine in the later part of the day. The effects of caffeine can last for more than 5 hours. Follow your health care provider's or dietitian's instructions for any diet changes. Lifestyle     Do not drink alcohol before bedtime. Alcohol can cause you to fall asleep at first, but then it can cause you to wake up in the middle of the night and have trouble getting back to sleep. Do  not use any products that contain nicotine or tobacco. These products include cigarettes, chewing tobacco, and vaping devices, such as e-cigarettes. If you need help quitting, ask your health care provider. Medicines Take over-the-counter and prescription medicines only as told by your health care provider. Do not use over-the-counter sleep medicine. You can become dependent on this medicine, and it can make sleep apnea worse. Do not use medicines, such as  sedatives and narcotics, unless told by your health care provider. Activity Exercise on most days, but avoid exercising in the evening. Exercising near bedtime can interfere with sleeping. If possible, spend time outside every day. Natural light helps regulate your circadian rhythm. General information Lose weight if you need to, and maintain a healthy weight. Keep all follow-up visits. This is important. If you are having surgery, make sure to tell your health care provider that you have sleep apnea. You may need to bring your device with you. Where to find more information Learn more about sleep apnea and daytime fatigue from: American Sleep Association: sleepassociation.org National Sleep Foundation: sleepfoundation.org National Heart, Lung, and Blood Institute: [employer] Summary Sleep apnea is a condition in which breathing pauses or becomes shallow during sleep. Sleep apnea can cause daytime fatigue and other serious health conditions. You may need to wear a device while sleeping to help keep your airway open. If you are having surgery, make sure to tell your health care provider that you have sleep apnea. You may need to bring your device with you. Making changes to sleep habits, diet, lifestyle, and activity can help you manage sleep apnea. This information is not intended to replace advice given to you by your health care provider. Make sure you discuss any questions you have with your health care provider. Document Revised: 08/28/2020 Document Reviewed: 12/29/2019 Elsevier Patient Education  2023 [employer].

## 2021-12-02 ENCOUNTER — Encounter: Payer: Self-pay | Admitting: Pulmonary Disease

## 2021-12-03 ENCOUNTER — Other Ambulatory Visit: Payer: Self-pay

## 2021-12-03 DIAGNOSIS — I10 Essential (primary) hypertension: Secondary | ICD-10-CM

## 2021-12-08 ENCOUNTER — Other Ambulatory Visit: Payer: Self-pay | Admitting: Family Medicine

## 2021-12-08 MED ORDER — TRIAMCINOLONE ACETONIDE 0.1 % EX CREA: 1.0000 | TOPICAL_CREAM | Freq: Two times a day (BID) | CUTANEOUS | 2 refills | Status: DC

## 2021-12-08 NOTE — Telephone Encounter (Signed)
Refill request Kenalog Last  refill 02/07/21  30 G Last office visit 11/19/21 acute

## 2021-12-12 ENCOUNTER — Ambulatory Visit: Payer: BC Managed Care – PPO

## 2021-12-12 DIAGNOSIS — G4733 Obstructive sleep apnea (adult) (pediatric): Secondary | ICD-10-CM

## 2021-12-12 DIAGNOSIS — R0683 Snoring: Secondary | ICD-10-CM

## 2021-12-18 ENCOUNTER — Telehealth: Payer: Self-pay | Admitting: Pulmonary Disease

## 2021-12-18 DIAGNOSIS — G4733 Obstructive sleep apnea (adult) (pediatric): Secondary | ICD-10-CM

## 2021-12-18 NOTE — Telephone Encounter (Signed)
Call patient  Sleep study result  Date of study: 12/12/2021  Impression: Severe obstructive sleep apnea Moderate oxygen desaturations  Recommendation: DME referral  Recommend CPAP therapy for severe obstructive sleep apnea  Auto titrating CPAP with pressure settings of 5- 20 will be appropriate  Encourage weight loss measures  Follow-up in the office 4 to 6 weeks following initiation of treatment

## 2021-12-19 NOTE — Telephone Encounter (Signed)
Spoke with the pt and notified of results of sleep study. Pt verbalized understanding and was agreeable to CPAP therapy. I have placed DME referral for this and pt scheduled for f/u here on 02/13/22. She will call sooner if needed.

## 2021-12-24 ENCOUNTER — Encounter: Payer: Self-pay | Admitting: Pulmonary Disease

## 2021-12-29 ENCOUNTER — Telehealth: Payer: Self-pay

## 2021-12-29 NOTE — Telephone Encounter (Signed)
I spoke with pt and she was seen at [facility] on 12/27/21; pt said she had swelling in mouth. Hands, and ears. Pt said she had a bad reaction to shellfish. Pt said UC gave her a shot and pt is doing better today to return home late today. Pt scheduled appt with Dr Milinda Antis on 12/31/21 at 12 noon with UC & ED precautions and pt voiced understanding. Sending note to Dr Milinda Antis and [employer]. I added shellfish to allergy list.

## 2021-12-29 NOTE — Telephone Encounter (Signed)
[facility] Night - Client TELEPHONE ADVICE RECORD [facility] Patient Name: Sharon Kerr Gender: Female DOB: Feb 06, 1975 Age: 46 Y 7 M 20 D Return Phone Number: [phone] (Primary), [phone] (Secondary) Address: City/ State/ Zip: [place]  [address] Client [facility] Night - Client Client Site [facility] - Night Provider Roxy Manns - MD Contact Type Call Who Is Calling Patient / Member / Family / Caregiver Call Type Triage / Clinical Relationship To Patient Self Return Phone Number [phone] (Primary) Chief Complaint Hives Reason for Call Symptomatic / Request for Health Information Initial Comment Caller states she has hives all of her body. She thinks it may be from shellfish yesterday. c/o itchy, no shortness of breath. Translation No Nurse Assessment Nurse: Kizzie Bane, RN, Marylene Land Date/Time Lamount Cohen Time): 12/26/2021 9:29:27 AM Confirm and document reason for call. If symptomatic, describe symptoms. ---Caller states she has had widespread hives since Wed. No fever Does the patient have any new or worsening symptoms? ---Yes Will a triage be completed? ---Yes Related visit to physician within the last 2 weeks? ---No Does the PT have any chronic conditions? (i.e. diabetes, asthma, this includes High risk factors for pregnancy, etc.) ---Yes List chronic conditions. ---diabetes, asthma Is the patient pregnant or possibly pregnant? (Ask all females between the ages of 58-55) ---No Is this a behavioral health or substance abuse call? ---No Guidelines Guideline Title Affirmed Question Affirmed Notes Nurse Date/Time (Eastern Time) Hives [1] Widespread hives, itching or facial swelling AND [2] onset < 2 hours of exposure to highrisk allergen (e.g., sting, nuts, 1st dose of antibiotic) Kizzie Bane, RN, Marylene Land 12/26/2021 9:30:28 AM PLEASE NOTE: All timestamps contained within this  report are represented as [place] Standard Time. CONFIDENTIALTY NOTICE: This fax transmission is intended only for the addressee. It contains information that is legally privileged, confidential or otherwise protected from use or disclosure. If you are not the intended recipient, you are strictly prohibited from reviewing, disclosing, copying using or disseminating any of this information or taking any action in reliance on or regarding this information. If you have received this fax in error, please notify us immediately by telephone so that we can arrange for its return to [place]. Phone: [phone], Toll-Free: [phone], Fax: [phone] Page: 2 of 2 Call Id: [id number] Disp. Time Lamount Cohen Time) Disposition Final User 12/26/2021 9:34:19 AM Go to ED Now Yes Kizzie Bane, RN, Marylene Land Final Disposition 12/26/2021 9:34:19 AM Go to ED Now Yes Kizzie Bane, RN, Rosalyn Charters Disagree/Comply Comply Caller Understands Yes PreDisposition Did not know what to do Care Advice Given Per Guideline GO TO ED NOW: * You need to be seen in the Emergency Department. CALL EMS 911 IF: * Difficulty breathing or wheezing occurs. CARE ADVICE given per Hives (Adult) guideline. Referrals GO TO FACILITY UNDECIDE

## 2021-12-29 NOTE — Telephone Encounter (Signed)
I will see her then  

## 2021-12-31 ENCOUNTER — Encounter: Payer: Self-pay | Admitting: Family Medicine

## 2021-12-31 ENCOUNTER — Ambulatory Visit: Payer: BC Managed Care – PPO | Admitting: Family Medicine

## 2021-12-31 VITALS — BP 140/88 | HR 83 | Temp 97.2°F

## 2021-12-31 DIAGNOSIS — Z91013 Allergy to seafood: Secondary | ICD-10-CM | POA: Diagnosis not present

## 2021-12-31 NOTE — Assessment & Plan Note (Signed)
Episode of angioedema following fish/shellfish  Prior to this had GI upset from shellfish several times Reviewed UC records/info today   Inst to carry epi pen and benadryl  Disc s/s of anaphylaxis Inst to avoid all sea food for now  Continue immuno tx for env allergies  Ref to allergist to discuss food testing

## 2021-12-31 NOTE — Patient Instructions (Addendum)
Avoid all seafood for now   Carry your epi pen   If you have a sudden allergic reaction (swelling of throat/mouth/lips or any wheezing or trouble breathing)  use benadryl and epi pen   I will place an allergist referral  If you don't get a call in 1-2 weeks let us know

## 2021-12-31 NOTE — Progress Notes (Signed)
Subjective:    Patient ID: Sharon Kerr, female    DOB: [birth year], 46 y.o.   MRN: [id number]  HPI Pt presents for rxn to shellfish  Wt Readings from Last 3 Encounters:  07/03/21 (!) 320 lb (145.2 kg)  02/06/21 (!) 330 lb (149.7 kg)  04/17/20 (!) 336 lb 12 oz (152.7 kg)        [facility] on [facility]25 Swelling in mouth/hands/ears after eating shellfish 3 d prior   Ate fish with crab on it  Within 3 hours had a rash on her abd Then spread to her legs  Next day lips are swollen/then ears , then hands  No wheezing   Was given injection of decadron 10 mg and oral benadryl  (within 30 min she started to have relief)  Px epi pen  Doing better   In the past she noted diarrhea after eating seafood    She takes allergy shots at [facility]  Was never tested for foods /all env and she was allergic to everything   Is better now  Back to normal  Tolerated the steroid   Does have a little headache   Patient Active Problem List   Diagnosis Date Noted   Shellfish allergy 12/31/2021   Snoring 11/11/2021   Apnea 11/11/2021   Hip pain, right 11/11/2021   COVID-19 03/04/2021   Wheezing 03/04/2021   Type 2 diabetes mellitus without complication, without long-term current use of insulin (HCC) 01/11/2020   Allergic rhinitis due to animal hair and dander 01/11/2020   Essential hypertension, benign 02/07/2016   PCOS (polycystic ovarian syndrome) 06/26/2014   Hypothyroidism associated with surgical procedure    Social anxiety disorder 12/16/2012   Panic attacks 12/16/2012   Allergic rhinitis due to pollen    Thyroid nodule 07/08/2011   Morbid obesity (HCC) 07/08/2011   Aortic regurgitation 07/06/2011   ACUTE GOUTY ARTHROPATHY 10/25/2009   Asthma 11/23/2007   Past Medical History:  Diagnosis Date   Allergic rhinitis due to pollen    Aortic regurgitation 07/06/2011   Asthma    History of high blood pressure    without diag   Hypothyroidism associated with  surgical procedure    Migraine    Panic attacks 12/16/2012   PCOS (polycystic ovarian syndrome) 06/26/2014   Social anxiety disorder 12/16/2012   Past Surgical History:  Procedure Laterality Date   BREAST ENHANCEMENT SURGERY  1999   CESAREAN SECTION     FOOT SURGERY  2001   LEEP     THYROIDECTOMY, PARTIAL  2009   [facility]   Social History   Tobacco Use   Smoking status: Never   Smokeless tobacco: Never  Vaping Use   Vaping Use: Never used  Substance Use Topics   Alcohol use: No    Alcohol/week: 0.0 standard drinks of alcohol   Drug use: No   Family History  Problem Relation Age of Onset   Breast cancer Mother 2   Allergies  Allergen Reactions   Oxycodone Itching and Rash    Other Reaction: Red all over   Shellfish Allergy Other (See Comments)    Swelling in mouth, ears and hands.   Codeine    Current Outpatient Medications on File Prior to Visit  Medication Sig Dispense Refill   albuterol (VENTOLIN HFA) 108 (90 Base) MCG/ACT inhaler Inhale 2 puffs into the lungs every 6 (six) hours as needed for wheezing or shortness of breath. 8 g 0   ARMOUR THYROID 60  MG tablet Take 60 mg by mouth daily.     atorvastatin (LIPITOR) 20 MG tablet TAKE 1 TABLET (20 MG TOTAL) BY MOUTH DAILY. MUST SEE PCP FOR FURTHER REFILLS. 90 tablet 3   busPIRone (BUSPAR) 15 MG tablet TAKE 1/2 TABLET (7.5 MG TOTAL) BY MOUTH TWICE A DAY 90 tablet 2   diclofenac (VOLTAREN) 75 MG EC tablet Take 1 tablet (75 mg total) by mouth 2 (two) times daily. 60 tablet 3   EPINEPHrine (EPIPEN IJ) Inject as directed.     hydrochlorothiazide (HYDRODIURIL) 25 MG tablet Take 1 tablet (25 mg total) by mouth daily. 90 tablet 3   medroxyPROGESTERone (PROVERA) 10 MG tablet Take 10 mg by mouth daily as needed.     metFORMIN (GLUCOPHAGE-XR) 500 MG 24 hr tablet Take 1 tablet (500 mg total) by mouth daily with breakfast. 90 tablet 3   triamcinolone cream (KENALOG) 0.1 % Apply 1 Application topically 2 (two) times daily. To affected  areas of psoriasis 30 g 2   No current facility-administered medications on file prior to visit.     Review of Systems  Constitutional:  Negative for activity change, appetite change, fatigue, fever and unexpected weight change.  HENT:  Negative for congestion, ear pain, rhinorrhea, sinus pressure and sore throat.   Eyes:  Negative for pain, redness and visual disturbance.  Respiratory:  Negative for cough, shortness of breath and wheezing.   Cardiovascular:  Negative for chest pain and palpitations.  Gastrointestinal:  Negative for abdominal pain, blood in stool, constipation and diarrhea.  Endocrine: Negative for polydipsia and polyuria.  Genitourinary:  Negative for dysuria, frequency and urgency.  Musculoskeletal:  Negative for arthralgias, back pain and myalgias.  Skin:  Negative for pallor and rash.  Allergic/Immunologic: Negative for environmental allergies.  Neurological:  Negative for dizziness, syncope and headaches.  Hematological:  Negative for adenopathy. Does not bruise/bleed easily.  Psychiatric/Behavioral:  Negative for decreased concentration and dysphoric mood. The patient is not nervous/anxious.        Objective:   Physical Exam Constitutional:      General: She is not in acute distress.    Appearance: Normal appearance. She is well-developed. She is obese. She is not ill-appearing or diaphoretic.  HENT:     Head: Normocephalic and atraumatic.     Comments: No facial swelling    Right Ear: Tympanic membrane, ear canal and external ear normal. There is no impacted cerumen.     Left Ear: Tympanic membrane, ear canal and external ear normal. There is no impacted cerumen.     Nose: No congestion or rhinorrhea.     Mouth/Throat:     Mouth: Mucous membranes are moist.     Pharynx: No oropharyngeal exudate or posterior oropharyngeal erythema.     Comments: No mouth/tongue/throat swelling  Eyes:     General: No scleral icterus.       Right eye: No discharge.         Left eye: No discharge.     Conjunctiva/sclera: Conjunctivae normal.     Pupils: Pupils are equal, round, and reactive to light.  Neck:     Thyroid: No thyromegaly.     Vascular: No carotid bruit or JVD.  Cardiovascular:     Rate and Rhythm: Normal rate and regular rhythm.     Heart sounds: Normal heart sounds.     No gallop.  Pulmonary:     Effort: Pulmonary effort is normal. No respiratory distress.     Breath sounds: Normal  breath sounds. No wheezing or rales.  Abdominal:     General: There is no distension or abdominal bruit.     Palpations: Abdomen is soft.  Musculoskeletal:     Cervical back: Normal range of motion and neck supple. No tenderness.     Right lower leg: No edema.     Left lower leg: No edema.  Lymphadenopathy:     Cervical: No cervical adenopathy.  Skin:    General: Skin is warm and dry.     Coloration: Skin is not jaundiced or pale.     Findings: No bruising, erythema or rash.  Neurological:     Mental Status: She is alert.     Cranial Nerves: No cranial nerve deficit.     Coordination: Coordination normal.     Deep Tendon Reflexes: Reflexes are normal and symmetric. Reflexes normal.  Psychiatric:        Mood and Affect: Mood normal.           Assessment & Plan:   Problem List Items Addressed This Visit       Other   Shellfish allergy - Primary    Episode of angioedema following fish/shellfish  Prior to this had GI upset from shellfish several times Reviewed UC records/info today   Inst to carry epi pen and benadryl  Disc s/s of anaphylaxis Inst to avoid all sea food for now  Continue immuno tx for env allergies  Ref to allergist to discuss food testing       Relevant Orders   Ambulatory referral to Allergy

## 2022-01-02 ENCOUNTER — Other Ambulatory Visit: Payer: Self-pay | Admitting: *Deleted

## 2022-01-02 MED ORDER — BUSPIRONE HCL 15 MG PO TABS: ORAL_TABLET | ORAL | 1 refills | Status: DC

## 2022-01-09 ENCOUNTER — Ambulatory Visit (INDEPENDENT_AMBULATORY_CARE_PROVIDER_SITE_OTHER)
Admission: RE | Admit: 2022-01-09 | Discharge: 2022-01-09 | Disposition: A | Discharge status: Home / Self Care | Payer: BC Managed Care – PPO | Source: Ambulatory Visit | Attending: Family | Admitting: Family

## 2022-01-09 ENCOUNTER — Encounter: Payer: Self-pay | Admitting: Family

## 2022-01-09 ENCOUNTER — Ambulatory Visit: Payer: BC Managed Care – PPO | Admitting: Family

## 2022-01-09 VITALS — BP 134/76 | HR 97 | Temp 98.0°F | Ht 67.0 in

## 2022-01-09 DIAGNOSIS — L409 Psoriasis, unspecified: Secondary | ICD-10-CM

## 2022-01-09 DIAGNOSIS — S8992XA Unspecified injury of left lower leg, initial encounter: Secondary | ICD-10-CM

## 2022-01-09 MED ORDER — CLOBETASOL PROPIONATE 0.05 % EX CREA: 1.0000 | TOPICAL_CREAM | Freq: Two times a day (BID) | CUTANEOUS | 2 refills | Status: DC

## 2022-01-09 NOTE — Patient Instructions (Addendum)
  Stop triamcinolone cream, start clobetasol. Use this twice daily for 10-14 days then do maintenance, twice a week.  Try gluten free diet for 14 days, see if rash improves.   If no improvement in the next two weeks see Dr. Dallas Schimke.   Xray knee today. Pending results.  Use some Voltaren gel to the knee.  Try some ibuprofen and tylenol together every 6-8 hours. (400 mg ibuprofen and 250-500 mg tylenol at a time) Do not max out tylenol at over 3000 mg once daily and do not take more than 2400 mg ibuprofen daily.   A referral was placed today. Please let us know if you have not heard back within 2 weeks about the referral.  Regards,   Dalasia Predmore FNP-C

## 2022-01-09 NOTE — Progress Notes (Signed)
Established Patient Office Visit  Subjective:  Patient ID: Sharon Kerr, female    DOB: 1975/08/16  Age: 46 y.o. MRN: [id number]  CC:  Chief Complaint  Patient presents with  . Fall    Sunday and hit knee swelled up big and painful  . Psoriasis    Has medication and is not working.   Marland Kitchen Urticaria    On elbow comes and goes and very itchy X 1 month    HPI Sharon Kerr is here today with concerns.   Fell Sunday six days ago, and was wearing clogs that were a bit loose and fell 'in slow motion' and landed on left knee. Since has been pretty big and painful. Also hit the left outer side of left shin. Also pain left heel. Has noticed some swelling lower patellar area, can move with ROM but if applying pressure below the knee with some pain. Heel is improving some but is painful to walk on. Feels very tight.     Past Medical History:  Diagnosis Date  . Allergic rhinitis due to pollen   . Aortic regurgitation 07/06/2011  . Asthma   . History of high blood pressure    without diag  . Hypothyroidism associated with surgical procedure   . Migraine   . Panic attacks 12/16/2012  . PCOS (polycystic ovarian syndrome) 06/26/2014  . Social anxiety disorder 12/16/2012    Past Surgical History:  Procedure Laterality Date  . BREAST ENHANCEMENT SURGERY  1999  . CESAREAN SECTION    . FOOT SURGERY  2001  . LEEP    . THYROIDECTOMY, PARTIAL  2009   [facility]    Family History  Problem Relation Age of Onset  . Breast cancer Mother 78    Social History   Socioeconomic History  . Marital status: Married    Spouse name: Chanetta Marshall  . Number of children: 1  . Years of education: some college  . Highest education level: Not on file  Occupational History  . Occupation: Biomedical scientist  Tobacco Use  . Smoking status: Never  . Smokeless tobacco: Never  Vaping Use  . Vaping Use: Never used  Substance and Sexual Activity  . Alcohol use: No    Alcohol/week: 0.0 standard drinks of alcohol  .  Drug use: No  . Sexual activity: Yes    Birth control/protection: None, Abstinence  Other Topics Concern  . 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 albuterol (VENTOLIN HFA) 108 (90 Base) MCG/ACT inhaler Inhale 2 puffs into the lungs every 6 (six) hours as needed for wheezing or shortness  of breath. 8 g 0  . ARMOUR THYROID 60 MG tablet Take 60 mg by mouth daily.    Marland Kitchen atorvastatin (LIPITOR) 20 MG tablet TAKE 1 TABLET (20 MG TOTAL) BY MOUTH DAILY. MUST SEE PCP FOR FURTHER REFILLS. 90 tablet 3  . budesonide (PULMICORT) 0.5 MG/2ML nebulizer solution Take 0.5 mg by nebulization daily.    . busPIRone (BUSPAR) 15 MG tablet TAKE 1/2 TABLET (7.5 MG TOTAL) BY MOUTH TWICE A DAY 90 tablet 1  . diclofenac (VOLTAREN) 75 MG EC tablet Take 1 tablet (75 mg total) by mouth 2 (two) times daily. 60 tablet 3  . EPINEPHrine (EPIPEN IJ) Inject as directed.    . hydrochlorothiazide (HYDRODIURIL) 25 MG tablet Take 1 tablet (25 mg total) by mouth daily. 90 tablet 3  . medroxyPROGESTERone (PROVERA) 10 MG tablet Take 10 mg by mouth daily as needed.    . metFORMIN (GLUCOPHAGE-XR) 500 MG 24 hr tablet Take 1 tablet (500 mg total) by mouth daily with breakfast. 90 tablet 3  . triamcinolone cream (KENALOG) 0.1 %  Apply 1 Application topically 2 (two) times daily. To affected areas of psoriasis 30 g 2   No facility-administered medications prior to visit.    Allergies  Allergen Reactions  . Oxycodone Itching and Rash    Other Reaction: Red all over  . Shellfish Allergy Other (See Comments)    Swelling in mouth, ears and hands.  . Codeine         Objective:    Physical Exam Constitutional:      Appearance: Normal appearance.  Musculoskeletal:     Right foot: Decreased range of motion.  Neurological:     General: No focal deficit present.     Mental Status: She is alert and oriented to person, place, and time. Mental status is at baseline.  Psychiatric:        Mood and Affect: Mood normal.        Behavior: Behavior normal.        Thought Content: Thought content normal.        Judgment: Judgment normal.    BP 134/76   Pulse 97   Temp 98 F (36.7 C)   Ht 5\' 7"  (1.702 m)   LMP 12/20/2021   SpO2 98%   BMI 50.12 kg/m  Wt Readings from Last 3 Encounters:  07/03/21 (!) 320 lb (145.2 kg)  02/06/21 (!) 330 lb (149.7 kg)  04/17/20 (!) 336 lb 12 oz (152.7 kg)     Health Maintenance Due  Topic Date Due  . FOOT EXAM  Never done  . HIV Screening  Never done  . Diabetic kidney evaluation - Urine ACR  Never done  . DTaP/Tdap/Td (1 - Tdap) Never done  . OPHTHALMOLOGY EXAM  04/20/2020  . COLONOSCOPY (Pts 45-25yrs Insurance coverage will need to be confirmed)  Never done  . MAMMOGRAM  09/03/2021  . PAP SMEAR-Modifier  09/03/2021  . COVID-19 Vaccine (4 - 2023-24 season) 10/03/2021  . HEMOGLOBIN A1C  01/07/2022    There are no preventive care reminders to display for this patient.  Lab Results  Component Value Date   TSH 0.82 07/08/2021   Lab Results  Component Value Date   WBC 8.6 07/08/2021   HGB 14.3 07/08/2021   HCT 42.7 07/08/2021   MCV 88.0 07/08/2021   PLT 247.0 07/08/2021   Lab Results  Component Value Date   NA 139 07/08/2021   K 3.8 07/08/2021   CO2 28  07/08/2021  GLUCOSE 125 (H) 07/08/2021   BUN 19 07/08/2021   CREATININE 0.58 07/08/2021   BILITOT 1.4 (H) 07/08/2021   ALKPHOS 46 07/08/2021   AST 18 07/08/2021   ALT 34 07/08/2021   PROT 6.3 07/08/2021   ALBUMIN 4.0 07/08/2021   CALCIUM 9.5 07/08/2021   ANIONGAP 10 02/06/2021   GFR 108.72 07/08/2021   Lab Results  Component Value Date   HGBA1C 6.4 (A) 07/08/2021      Assessment & Plan:   Problem List Items Addressed This Visit   None   No orders of the defined types were placed in this encounter.   Follow-up: No follow-ups on file.    Mort Sawyers, FNP

## 2022-01-09 NOTE — Progress Notes (Signed)
There is some osteoarthritis in the knee. No acute fracture on xray. There is something that they suspect is an osteochondroma of the knee which can cause increased pain ,tingling/numbness around the knee at times depending on the size. I am curious if this was already there and was exacerbated with the swelling from the fall. I would at this time suggest referral to orthopedist for a general workup, is pt ok with this?

## 2022-01-10 DIAGNOSIS — S8992XA Unspecified injury of left lower leg, initial encounter: Secondary | ICD-10-CM | POA: Insufficient documentation

## 2022-01-10 DIAGNOSIS — L409 Psoriasis, unspecified: Secondary | ICD-10-CM | POA: Insufficient documentation

## 2022-01-10 NOTE — Assessment & Plan Note (Signed)
Xray knee today  Wear compression sleeve Wear supportive brace for left lower ankle to provide stabilty  Ice, alternate with heat Ibuprofen/tylenol and voltaren gel for pain  Elevate knee

## 2022-01-10 NOTE — Assessment & Plan Note (Signed)
Rx clobetasol 0.05%

## 2022-01-20 NOTE — Telephone Encounter (Signed)
The order was placed in Nov but was put in so that it hit the Critical Care Workque instead of pulmonary workque - order never came to the [facility].  Chan sent the order to [facility] yesterday.  I just spoke to Palmer at [employer] - he states he has the order and will change to urgent and try to get it processed as quickly as possible.  Not sure if it will be able to be done by the end of the year. Will route back to triage so pt can be made aware thru MyChart and close out the message.

## 2022-01-24 ENCOUNTER — Other Ambulatory Visit: Payer: Self-pay | Admitting: Nurse Practitioner

## 2022-01-24 DIAGNOSIS — R062 Wheezing: Secondary | ICD-10-CM

## 2022-02-06 ENCOUNTER — Ambulatory Visit: Payer: BC Managed Care – PPO | Admitting: Pulmonary Disease

## 2022-02-09 ENCOUNTER — Encounter: Payer: Self-pay | Admitting: Pulmonary Disease

## 2022-02-09 ENCOUNTER — Telehealth: Payer: BC Managed Care – PPO | Admitting: Physician Assistant

## 2022-02-09 DIAGNOSIS — J208 Acute bronchitis due to other specified organisms: Secondary | ICD-10-CM | POA: Diagnosis not present

## 2022-02-09 DIAGNOSIS — B9689 Other specified bacterial agents as the cause of diseases classified elsewhere: Secondary | ICD-10-CM

## 2022-02-09 MED ORDER — PREDNISONE 20 MG PO TABS: 40.0000 mg | ORAL_TABLET | Freq: Every day | ORAL | 0 refills | Status: DC

## 2022-02-09 MED ORDER — AMOXICILLIN-POT CLAVULANATE 875-125 MG PO TABS: 1.0000 | ORAL_TABLET | Freq: Two times a day (BID) | ORAL | 0 refills | Status: DC

## 2022-02-09 MED ORDER — PROMETHAZINE-DM 6.25-15 MG/5ML PO SYRP: 5.0000 mL | ORAL_SOLUTION | Freq: Four times a day (QID) | ORAL | 0 refills | Status: DC | PRN

## 2022-02-09 MED ORDER — BENZONATATE 100 MG PO CAPS: 100.0000 mg | ORAL_CAPSULE | Freq: Three times a day (TID) | ORAL | 0 refills | Status: DC | PRN

## 2022-02-09 NOTE — Telephone Encounter (Signed)
Mychart message sent by pt: Sharon [facility] (supporting Adewale A Olalere, MD)22 minutes ago (9:14 AM)   CX Hi Dr. Ander Slade,   I wanted to let you know that I received and began using my CPAP last Wednesday. However, on Thursday I began with symptoms of congestions which quickly turned into a tight cough, fever and chills.    I am being treated for bacterial bronchitis and they have recommended that I discontinue use of the CPAP for 2 days so that the medications get into my system.    Can you please make a note of this on my file so that my insurance carrier doesn't think that I am just not using the machine?   Additionally I was wondering if you could increate the ramp time of my machine. I tend to take a long time to fall asleep and the machine starts kicking in too quickly for me. This in turn makes me very anxious and makes it harder to fall asleep.    Let me know if this is possible.   Thanks you!   Best, Sharon Kerr    Dr. Jenetta Downer, please advise.

## 2022-02-09 NOTE — Patient Instructions (Signed)
Sharon Kerr, thank you for joining Mar Daring, PA-C for today's virtual visit.  While this provider is not your primary care provider (PCP), if your PCP is located in our provider database this encounter information will be shared with them immediately following your visit.   [facility] account gives you access to today's visit and all your visits, tests, and labs performed at [facility] " click here if you don't have a [facility] account or go to [email or web address]  Consent: (Patient) Sharon Kerr provided verbal consent for this virtual visit at the beginning of the encounter.  Current Medications:  Current Outpatient Medications:    amoxicillin-clavulanate (AUGMENTIN) 875-125 MG tablet, Take 1 tablet by mouth 2 (two) times daily., Disp: 20 tablet, Rfl: 0   benzonatate (TESSALON) 100 MG capsule, Take 1 capsule (100 mg total) by mouth 3 (three) times daily as needed., Disp: 30 capsule, Rfl: 0   predniSONE (DELTASONE) 20 MG tablet, Take 2 tablets (40 mg total) by mouth daily with breakfast., Disp: 10 tablet, Rfl: 0   promethazine-dextromethorphan (PROMETHAZINE-DM) 6.25-15 MG/5ML syrup, Take 5 mLs by mouth 4 (four) times daily as needed., Disp: 118 mL, Rfl: 0   albuterol (VENTOLIN HFA) 108 (90 Base) MCG/ACT inhaler, TAKE 2 PUFFS BY MOUTH EVERY 6 HOURS AS NEEDED FOR WHEEZE OR SHORTNESS OF BREATH, Disp: 18 each, Rfl: 3   ARMOUR THYROID 60 MG tablet, Take 60 mg by mouth daily., Disp: , Rfl:    atorvastatin (LIPITOR) 20 MG tablet, TAKE 1 TABLET (20 MG TOTAL) BY MOUTH DAILY. MUST SEE PCP FOR FURTHER REFILLS., Disp: 90 tablet, Rfl: 3   budesonide (PULMICORT) 0.5 MG/2ML nebulizer solution, Take 0.5 mg by nebulization daily., Disp: , Rfl:    busPIRone (BUSPAR) 15 MG tablet, TAKE 1/2 TABLET (7.5 MG TOTAL) BY MOUTH TWICE A DAY, Disp: 90 tablet, Rfl: 1   clobetasol cream (TEMOVATE) 0.99 %, Apply 1 Application topically 2 (two) times daily., Disp: 30  g, Rfl: 2   diclofenac (VOLTAREN) 75 MG EC tablet, Take 1 tablet (75 mg total) by mouth 2 (two) times daily., Disp: 60 tablet, Rfl: 3   EPINEPHrine (EPIPEN IJ), Inject as directed., Disp: , Rfl:    hydrochlorothiazide (HYDRODIURIL) 25 MG tablet, Take 1 tablet (25 mg total) by mouth daily., Disp: 90 tablet, Rfl: 3   medroxyPROGESTERone (PROVERA) 10 MG tablet, Take 10 mg by mouth daily as needed., Disp: , Rfl:    metFORMIN (GLUCOPHAGE-XR) 500 MG 24 hr tablet, Take 1 tablet (500 mg total) by mouth daily with breakfast., Disp: 90 tablet, Rfl: 3   Medications ordered in this encounter:  Meds ordered this encounter  Medications   amoxicillin-clavulanate (AUGMENTIN) 875-125 MG tablet    Sig: Take 1 tablet by mouth 2 (two) times daily.    Dispense:  20 tablet    Refill:  0    Order Specific Question:   Supervising Provider    Answer:   Chase Picket [[id number]]   benzonatate (TESSALON) 100 MG capsule    Sig: Take 1 capsule (100 mg total) by mouth 3 (three) times daily as needed.    Dispense:  30 capsule    Refill:  0    Order Specific Question:   Supervising Provider    Answer:   Chase Picket [id number]   promethazine-dextromethorphan (PROMETHAZINE-DM) 6.25-15 MG/5ML syrup    Sig: Take 5 mLs by mouth 4 (four) times daily as needed.    Dispense:  118 mL  Refill:  0    Order Specific Question:   Supervising Provider    Answer:   Merrilee Jansky [[id number]]   predniSONE (DELTASONE) 20 MG tablet    Sig: Take 2 tablets (40 mg total) by mouth daily with breakfast.    Dispense:  10 tablet    Refill:  0    Order Specific Question:   Supervising Provider    Answer:   Merrilee Jansky [[id number]]     *If you need refills on other medications prior to your next appointment, please contact your pharmacy*  Follow-Up: Call back or seek an in-person evaluation if the symptoms worsen or if the condition fails to improve as anticipated.   Virtual Care [phone]  Other  Instructions Upper Respiratory Infection, Adult An upper respiratory infection (URI) is a common viral infection of the nose, throat, and upper air passages that lead to the lungs. The most common type of URI is the common cold. URIs usually get better on their own, without medical treatment. What are the causes? A URI is caused by a virus. You may catch a virus by: Breathing in droplets from an infected person's cough or sneeze. Touching something that has been exposed to the virus (is contaminated) and then touching your mouth, nose, or eyes. What increases the risk? You are more likely to get a URI if: You are very young or very old. You have close contact with others, such as at work, school, or a health care facility. You smoke. You have long-term (chronic) heart or lung disease. You have a weakened disease-fighting system (immune system). You have nasal allergies or asthma. You are experiencing a lot of stress. You have poor nutrition. What are the signs or symptoms? A URI usually involves some of the following symptoms: Runny or stuffy (congested) nose. Cough. Sneezing. Sore throat. Headache. Fatigue. Fever. Loss of appetite. Pain in your forehead, behind your eyes, and over your cheekbones (sinus pain). Muscle aches. Redness or irritation of the eyes. Pressure in the ears or face. How is this diagnosed? This condition may be diagnosed based on your medical history and symptoms, and a physical exam. Your health care provider may use a swab to take a mucus sample from your nose (nasal swab). This sample can be tested to determine what virus is causing the illness. How is this treated? URIs usually get better on their own within 7-10 days. Medicines cannot cure URIs, but your health care provider may recommend certain medicines to help relieve symptoms, such as: Over-the-counter cold medicines. Cough suppressants. Coughing is a type of defense against infection that helps to  clear the respiratory system, so take these medicines only as recommended by your health care provider. Fever-reducing medicines. Follow these instructions at home: Activity Rest as needed. If you have a fever, stay home from work or school until your fever is gone or until your health care provider says your URI cannot spread to other people (is no longer contagious). Your health care provider may have you wear a face mask to prevent your infection from spreading. Relieving symptoms Gargle with a mixture of salt and water 3-4 times a day or as needed. To make salt water, completely dissolve -1 tsp (3-6 g) of salt in 1 cup (237 mL) of warm water. Use a cool-mist humidifier to add moisture to the air. This can help you breathe more easily. Eating and drinking  Drink enough fluid to keep your urine pale yellow. Eat soups  and other clear broths. General instructions  Take over-the-counter and prescription medicines only as told by your health care provider. These include cold medicines, fever reducers, and cough suppressants. Do not use any products that contain nicotine or tobacco. These products include cigarettes, chewing tobacco, and vaping devices, such as e-cigarettes. If you need help quitting, ask your health care provider. Stay away from secondhand smoke. Stay up to date on all immunizations, including the yearly (annual) flu vaccine. Keep all follow-up visits. This is important. How to prevent the spread of infection to others URIs can be contagious. To prevent the infection from spreading: Wash your hands with soap and water for at least 20 seconds. If soap and water are not available, use hand sanitizer. Avoid touching your mouth, face, eyes, or nose. Cough or sneeze into a tissue or your sleeve or elbow instead of into your hand or into the air.  Contact a health care provider if: You are getting worse instead of better. You have a fever or chills. Your mucus is brown or  red. You have yellow or brown discharge coming from your nose. You have pain in your face, especially when you bend forward. You have swollen neck glands. You have pain while swallowing. You have white areas in the back of your throat. Get help right away if: You have shortness of breath that gets worse. You have severe or persistent: Headache. Ear pain. Sinus pain. Chest pain. You have chronic lung disease along with any of the following: Making high-pitched whistling sounds when you breathe, most often when you breathe out (wheezing). Prolonged cough (more than 14 days). Coughing up blood. A change in your usual mucus. You have a stiff neck. You have changes in your: Vision. Hearing. Thinking. Mood. These symptoms may be an emergency. Get help right away. Call 911. Do not wait to see if the symptoms will go away. Do not drive yourself to the hospital. Summary An upper respiratory infection (URI) is a common infection of the nose, throat, and upper air passages that lead to the lungs. A URI is caused by a virus. URIs usually get better on their own within 7-10 days. Medicines cannot cure URIs, but your health care provider may recommend certain medicines to help relieve symptoms. This information is not intended to replace advice given to you by your health care provider. Make sure you discuss any questions you have with your health care provider. Document Revised: 08/21/2020 Document Reviewed: 08/21/2020 Elsevier Patient Education  2023 Elsevier Inc.    If you have been instructed to have an in-person evaluation today at a local Urgent Care facility, please use the link below. It will take you to a list of all of our available [facility], including address, phone number and hours of operation. Please do not delay care.  [facility]  If you or a family member do not have a primary care provider, use the link below to schedule a visit and establish  care. When you choose a [facility] primary care physician or advanced practice provider, you gain a long-term partner in health. Find a Primary Care Provider  Learn more about [facility]'s in-office and virtual care options: [facility] - Get Care Now

## 2022-02-09 NOTE — Progress Notes (Signed)
Virtual Visit Consent   Sharon Kerr, you are scheduled for a virtual visit with a [facility] provider today. Just as with appointments in the office, your consent must be obtained to participate. Your consent will be active for this visit and any virtual visit you may have with one of our providers in the next 365 days. If you have a MyChart account, a copy of this consent can be sent to you electronically.  As this is a virtual visit, video technology does not allow for your provider to perform a traditional examination. This may limit your provider's ability to fully assess your condition. If your provider identifies any concerns that need to be evaluated in person or the need to arrange testing (such as labs, EKG, etc.), we will make arrangements to do so. Although advances in technology are sophisticated, we cannot ensure that it will always work on either your end or our end. If the connection with a video visit is poor, the visit may have to be switched to a telephone visit. With either a video or telephone visit, we are not always able to ensure that we have a secure connection.  By engaging in this virtual visit, you consent to the provision of healthcare and authorize for your insurance to be billed (if applicable) for the services provided during this visit. Depending on your insurance coverage, you may receive a charge related to this service.  I need to obtain your verbal consent now. Are you willing to proceed with your visit today? Sharon Kerr has provided verbal consent on 02/09/2022 for a virtual visit (video or telephone). Mar Daring, PA-C  Date: 02/09/2022 9:08 AM  Virtual Visit via Video Note   I, Mar Daring, connected with  Sharon Kerr  ([id number], 22-Jul-1975) on 02/09/22 at  9:00 AM EST by a video-enabled telemedicine application and verified that I am speaking with the correct person using two identifiers.  Location: Patient: Virtual Visit Location  Patient: Home Provider: Virtual Visit Location Provider: Home Office   I discussed the limitations of evaluation and management by telemedicine and the availability of in person appointments. The patient expressed understanding and agreed to proceed.    History of Present Illness: Sharon Kerr is a 47 y.o. who identifies as a female who was assigned female at birth, and is being seen today for Flu-like symptoms.  HPI: URI  This is a new problem. The current episode started in the past 7 days (Thursday, 02/05/22). The problem has been gradually worsening. The maximum temperature recorded prior to her arrival was 101 - 101.9 F. The fever has been present for 1 to 2 days. Associated symptoms include chest pain (tightness), congestion, coughing (tight cough with pain between her shoulders when coughing), diarrhea (on 1st day), headaches, rhinorrhea, sinus pain and sneezing. Pertinent negatives include no ear pain, nausea, plugged ear sensation, sore throat, vomiting or wheezing. Associated symptoms comments: Hoarse voice. Treatments tried: dayquil, nyquil, theraflu tea, vicks, hot showers, sudafed. The treatment provided no relief.   Negative at home covid testing x 3   Problems:  Patient Active Problem List   Diagnosis Date Noted   Psoriasis 01/10/2022   Left knee injury, initial encounter 01/10/2022   Shellfish allergy 12/31/2021   Snoring 11/11/2021   Apnea 11/11/2021   Wheezing 03/04/2021   Type 2 diabetes mellitus without complication, without long-term current use of insulin ([place]) 01/11/2020   Allergic rhinitis due to animal hair and dander 01/11/2020   Essential hypertension, benign  02/07/2016   PCOS (polycystic ovarian syndrome) 06/26/2014   Hypothyroidism associated with surgical procedure    Social anxiety disorder 12/16/2012   Panic attacks 12/16/2012   Allergic rhinitis due to pollen    Thyroid nodule 07/08/2011   Morbid obesity (HCC) 07/08/2011   Aortic regurgitation  07/06/2011   ACUTE GOUTY ARTHROPATHY 10/25/2009   Asthma 11/23/2007    Allergies:  Allergies  Allergen Reactions   Oxycodone Itching and Rash    Other Reaction: Red all over   Shellfish Allergy Other (See Comments)    Swelling in mouth, ears and hands.   Codeine    Medications:  Current Outpatient Medications:    albuterol (VENTOLIN HFA) 108 (90 Base) MCG/ACT inhaler, TAKE 2 PUFFS BY MOUTH EVERY 6 HOURS AS NEEDED FOR WHEEZE OR SHORTNESS OF BREATH, Disp: 18 each, Rfl: 3   amoxicillin-clavulanate (AUGMENTIN) 875-125 MG tablet, Take 1 tablet by mouth 2 (two) times daily., Disp: 20 tablet, Rfl: 0   ARMOUR THYROID 60 MG tablet, Take 60 mg by mouth daily., Disp: , Rfl:    atorvastatin (LIPITOR) 20 MG tablet, TAKE 1 TABLET (20 MG TOTAL) BY MOUTH DAILY. MUST SEE PCP FOR FURTHER REFILLS., Disp: 90 tablet, Rfl: 3   benzonatate (TESSALON) 100 MG capsule, Take 1 capsule (100 mg total) by mouth 3 (three) times daily as needed., Disp: 30 capsule, Rfl: 0   budesonide (PULMICORT) 0.5 MG/2ML nebulizer solution, Take 0.5 mg by nebulization daily., Disp: , Rfl:    busPIRone (BUSPAR) 15 MG tablet, TAKE 1/2 TABLET (7.5 MG TOTAL) BY MOUTH TWICE A DAY, Disp: 90 tablet, Rfl: 1   clobetasol cream (TEMOVATE) 0.05 %, Apply 1 Application topically 2 (two) times daily., Disp: 30 g, Rfl: 2   diclofenac (VOLTAREN) 75 MG EC tablet, Take 1 tablet (75 mg total) by mouth 2 (two) times daily., Disp: 60 tablet, Rfl: 3   EPINEPHrine (EPIPEN IJ), Inject as directed., Disp: , Rfl:    hydrochlorothiazide (HYDRODIURIL) 25 MG tablet, Take 1 tablet (25 mg total) by mouth daily., Disp: 90 tablet, Rfl: 3   medroxyPROGESTERone (PROVERA) 10 MG tablet, Take 10 mg by mouth daily as needed., Disp: , Rfl:    metFORMIN (GLUCOPHAGE-XR) 500 MG 24 hr tablet, Take 1 tablet (500 mg total) by mouth daily with breakfast., Disp: 90 tablet, Rfl: 3   predniSONE (DELTASONE) 20 MG tablet, Take 2 tablets (40 mg total) by mouth daily with breakfast.,  Disp: 10 tablet, Rfl: 0   promethazine-dextromethorphan (PROMETHAZINE-DM) 6.25-15 MG/5ML syrup, Take 5 mLs by mouth 4 (four) times daily as needed., Disp: 118 mL, Rfl: 0  Observations/Objective: Patient is well-developed, well-nourished in no acute distress.  Resting comfortably at home.  Head is normocephalic, atraumatic.  No labored breathing.  Speech is clear and coherent with logical content.  Patient is alert and oriented at baseline.    Assessment and Plan: 1. Acute bacterial bronchitis - amoxicillin-clavulanate (AUGMENTIN) 875-125 MG tablet; Take 1 tablet by mouth 2 (two) times daily.  Dispense: 20 tablet; Refill: 0 - benzonatate (TESSALON) 100 MG capsule; Take 1 capsule (100 mg total) by mouth 3 (three) times daily as needed.  Dispense: 30 capsule; Refill: 0 - promethazine-dextromethorphan (PROMETHAZINE-DM) 6.25-15 MG/5ML syrup; Take 5 mLs by mouth 4 (four) times daily as needed.  Dispense: 118 mL; Refill: 0 - predniSONE (DELTASONE) 20 MG tablet; Take 2 tablets (40 mg total) by mouth daily with breakfast.  Dispense: 10 tablet; Refill: 0  - Worsening over a week despite OTC medications - Will  treat with Augmentin, Prednisone, Promethazine DM and tessalon perles - Can continue Mucinex  - Push fluids.  - Rest.  - Steam and humidifier can help - Will hold off on CPAP for a night or two until breathing through nose has improved. - Seek in person evaluation if worsening or symptoms fail to improve    Follow Up Instructions: I discussed the assessment and treatment plan with the patient. The patient was provided an opportunity to ask questions and all were answered. The patient agreed with the plan and demonstrated an understanding of the instructions.  A copy of instructions were sent to the patient via MyChart unless otherwise noted below.    The patient was advised to call back or seek an in-person evaluation if the symptoms worsen or if the condition fails to improve as  anticipated.  Time:  I spent 15 minutes with the patient via telehealth technology discussing the above problems/concerns.    Margaretann Loveless, PA-C

## 2022-02-10 NOTE — Telephone Encounter (Signed)
Ramp time can be increased by [place] you feel better soonest

## 2022-02-13 ENCOUNTER — Ambulatory Visit: Payer: BC Managed Care – PPO | Admitting: Pulmonary Disease

## 2022-02-17 ENCOUNTER — Ambulatory Visit: Payer: Self-pay | Admitting: Allergy & Immunology

## 2022-02-17 NOTE — Progress Notes (Signed)
Sharon Mealey T. Lyndel Dancel, MD, [place] at [facility] [place], [address]  Phone: [phone]  FAX: [phone]  Sharon Kerr - 47 y.o. female  MRN [id number]  Date of Birth: [birth year]  Date: 02/18/2022  PCP: Abner Greenspan, MD  Referral: Abner Greenspan, MD  Chief Complaint  Patient presents with   Knee Pain    Left-Seen [place] on 01/09/22   Fall   Bronchitis    Had bronchitis last week and would like her lungs listened to   Subjective:   Sharon Kerr is a 47 y.o. very pleasant female patient with Body mass index is 50.12 kg/m. who presents with the following:  The patient presents with acute knee pain after a fall.  He had a fall roughly 6 weeks ago, and at that point she had some distinct pain in the knee.  Initially, her knee became swollen and painful.  At that time, she was wearing some clogs that were little loose and she fell and landed on the anterior aspect of her knee.  The radiological images were independently reviewed by myself in the office and results were reviewed with the patient.  Nonweightbearing films.  My independent interpretation of images: She does have mild tricompartmental osteoarthritis.  Obvious osteochondroma. Electronically Signed  By: Owens Loffler, MD On: 02/18/2022 10:20 AM EST   About six weeks ago, has been laying off of it.   On Monday, tried to do a cat cow.  Felt like a ball in it.   Motion has been improving.    Full ROM,minimal pain Medial > lateral joint line  She also has been having some upper airway congestion and lower congestion/bronchitis for over a week.  She has been improving steadily, but she still has some coughing.  Inject L knee today  Review of Systems is noted in the HPI, as appropriate  Objective:   BP (!) 124/90   Pulse 88   Temp 98.6 F (37 C) (Oral)   Ht 5\' 7"  (1.702 m)   SpO2 96%   BMI 50.12 kg/m   Left knee: Full extension.  Flexion  to 120.  Stable to varus and valgus stress.  She does have some pain with loading the medial lateral patellar facets.  Medial greater than lateral joint line tenderness.  Any forced flexion causes pain.   Gen: WDWN, NAD. Globally Non-toxic HEENT: Throat clear, w/o exudate, R TM clear, L TM - good landmarks, No fluid present. No rhinnorhea.  MMM Frontal sinuses: NT Max sinuses: NT NECK: Anterior cervical  LAD is absent CV: RRR, No M/G/R, cap refill <2 sec PULM: Breathing comfortably in no respiratory distress. no wheezing, crackles, rhonchi   Laboratory and Imaging Data:  Assessment and Plan:     ICD-10-CM   1. Primary osteoarthritis of left knee  M17.12 triamcinolone acetonide (KENALOG-40) injection 40 mg    2. Acute bronchitis due to other specified organisms  J20.8      Acute on chronic left-sided knee osteoarthritis with exacerbation.  Acute injury to the left knee.  Medial compartmental involvement.  Recovering from respiratory illness.  Lungs are clear.  Anticipate resolving viral bronchitis.  Continue with oral NSAIDs, topicals, and we will also do an intra-articular knee injection today.  She will follow-up with me in 1 month if she has not improved.  Social: Right now her knee pain is limiting her ability to do high intensity interval training that she enjoys and  [employer]  Aspiration/Injection Procedure Note Sharon Kerr February 24, 1975 Date of procedure: 02/18/2022  Procedure: Large Joint Aspiration / Injection of Knee, L Indications: Pain  Procedure Details Patient verbally consented to procedure. Risks, benefits, and alternatives explained. Sterilely prepped with Chloraprep. Ethyl cholride used for anesthesia. 9 cc Lidocaine 1% mixed with 1 mL of Kenalog 40 mg injected using the anteromedial approach without difficulty. No complications with procedure and tolerated well. Patient had decreased pain post-injection. Medication: 1 mL of Kenalog 40 mg   Medication  Management during today's office visit: Meds ordered this encounter  Medications   triamcinolone acetonide (KENALOG-40) injection 40 mg   There are no discontinued medications.  Orders placed today for conditions managed today: No orders of the defined types were placed in this encounter.   Disposition: No follow-ups on file.  Dr[employer] One speech-to-text software was used for transcription in this dictation.  Possible transcriptional errors can occur using Editor, commissioning.   Signed,  Maud Deed. Larissa Pegg, MD   Outpatient Encounter Medications as of 02/18/2022  Medication Sig   albuterol (VENTOLIN HFA) 108 (90 Base) MCG/ACT inhaler TAKE 2 PUFFS BY MOUTH EVERY 6 HOURS AS NEEDED FOR WHEEZE OR SHORTNESS OF BREATH   amoxicillin-clavulanate (AUGMENTIN) 875-125 MG tablet Take 1 tablet by mouth 2 (two) times daily.   ARMOUR THYROID 60 MG tablet Take 60 mg by mouth daily.   atorvastatin (LIPITOR) 20 MG tablet TAKE 1 TABLET (20 MG TOTAL) BY MOUTH DAILY. MUST SEE PCP FOR FURTHER REFILLS.   benzonatate (TESSALON) 100 MG capsule Take 1 capsule (100 mg total) by mouth 3 (three) times daily as needed.   budesonide (PULMICORT) 0.5 MG/2ML nebulizer solution Take 0.5 mg by nebulization daily.   busPIRone (BUSPAR) 15 MG tablet TAKE 1/2 TABLET (7.5 MG TOTAL) BY MOUTH TWICE A DAY   clobetasol cream (TEMOVATE) 6.37 % Apply 1 Application topically 2 (two) times daily.   diclofenac (VOLTAREN) 75 MG EC tablet Take 1 tablet (75 mg total) by mouth 2 (two) times daily.   EPINEPHrine (EPIPEN IJ) Inject as directed.   hydrochlorothiazide (HYDRODIURIL) 25 MG tablet Take 1 tablet (25 mg total) by mouth daily.   medroxyPROGESTERone (PROVERA) 10 MG tablet Take 10 mg by mouth daily as needed.   metFORMIN (GLUCOPHAGE-XR) 500 MG 24 hr tablet Take 1 tablet (500 mg total) by mouth daily with breakfast.   predniSONE (DELTASONE) 20 MG tablet Take 2 tablets (40 mg total) by mouth daily with breakfast.    promethazine-dextromethorphan (PROMETHAZINE-DM) 6.25-15 MG/5ML syrup Take 5 mLs by mouth 4 (four) times daily as needed.   [EXPIRED] triamcinolone acetonide (KENALOG-40) injection 40 mg    No facility-administered encounter medications on file as of 02/18/2022.  I

## 2022-02-18 ENCOUNTER — Encounter: Payer: Self-pay | Admitting: Family Medicine

## 2022-02-18 ENCOUNTER — Ambulatory Visit: Payer: BC Managed Care – PPO | Admitting: Family Medicine

## 2022-02-18 VITALS — BP 124/90 | HR 88 | Temp 98.6°F | Ht 67.0 in

## 2022-02-18 DIAGNOSIS — M1712 Unilateral primary osteoarthritis, left knee: Secondary | ICD-10-CM | POA: Diagnosis not present

## 2022-02-18 DIAGNOSIS — J208 Acute bronchitis due to other specified organisms: Secondary | ICD-10-CM

## 2022-02-18 MED ORDER — TRIAMCINOLONE ACETONIDE 40 MG/ML IJ SUSP
40.0000 mg | Freq: Once | INTRAMUSCULAR | Status: AC
  Administered 2022-02-18: 40 mg via INTRA_ARTICULAR

## 2022-02-19 ENCOUNTER — Encounter: Payer: Self-pay | Admitting: Family Medicine

## 2022-03-02 ENCOUNTER — Telehealth (INDEPENDENT_AMBULATORY_CARE_PROVIDER_SITE_OTHER): Payer: BC Managed Care – PPO | Admitting: Family Medicine

## 2022-03-02 ENCOUNTER — Encounter: Payer: Self-pay | Admitting: Family Medicine

## 2022-03-02 VITALS — Temp 99.0°F

## 2022-03-02 DIAGNOSIS — R6889 Other general symptoms and signs: Secondary | ICD-10-CM

## 2022-03-02 MED ORDER — PREDNISONE 10 MG PO TABS: ORAL_TABLET | ORAL | 0 refills | Status: DC

## 2022-03-02 MED ORDER — OSELTAMIVIR PHOSPHATE 75 MG PO CAPS: 75.0000 mg | ORAL_CAPSULE | Freq: Two times a day (BID) | ORAL | 0 refills | Status: DC

## 2022-03-02 NOTE — Progress Notes (Signed)
Virtual Visit via Video Note  I connected with Sharon Kerr on 03/02/22 at 12:30 PM EST by a video enabled telemedicine application and verified that I am speaking with the correct person using two identifiers.  Location: Patient: home/ in bed Provider: office    I discussed the limitations of evaluation and management by telemedicine and the availability of in person appointments. The patient expressed understanding and agreed to proceed.  Parties involved in encounter  Patient: Sharon Kerr   Provider:  Roxy Manns MD   History of Present Illness: Pt presents with uri symptoms and fever  Tamp is 99 today   Was better after bronchitis several weeks ago  Has h/o frequent sinusitis and saw ENT (suggested allergy shots)    Started Saturday with bad cough Felt tight  Lungs felt sore from cough  Then cough got deeper and more rattly  Very congested Clear to yellow mucous is thick  Fever  up to 101  Body aches Headache worse with cough -sides and top   Some wheezing - not unusual for being sick Has an albuterol mdi  Used it on Saturday    Nausea / not vomiting No appetite  No diarrhea    Did not get a flu shot this year    Took covid test at home, negative   Uses cpap   Otc  Day quil  Thera flu   Last night she took some tylenol   Has some tessalon peales  Has some prometh DM also    Patient Active Problem List   Diagnosis Date Noted   Flu-like symptoms 03/02/2022   Psoriasis 01/10/2022   Left knee injury, initial encounter 01/10/2022   Shellfish allergy 12/31/2021   Snoring 11/11/2021   Apnea 11/11/2021   Wheezing 03/04/2021   Type 2 diabetes mellitus without complication, without long-term current use of insulin (HCC) 01/11/2020   Allergic rhinitis due to animal hair and dander 01/11/2020   Essential hypertension, benign 02/07/2016   PCOS (polycystic ovarian syndrome) 06/26/2014   Hypothyroidism associated with surgical procedure     Social anxiety disorder 12/16/2012   Panic attacks 12/16/2012   Allergic rhinitis due to pollen    Thyroid nodule 07/08/2011   Morbid obesity (HCC) 07/08/2011   Aortic regurgitation 07/06/2011   ACUTE GOUTY ARTHROPATHY 10/25/2009   Asthma 11/23/2007   Past Medical History:  Diagnosis Date   Allergic rhinitis due to pollen    Aortic regurgitation 07/06/2011   Asthma    History of high blood pressure    without diag   Hypothyroidism associated with surgical procedure    Migraine    Panic attacks 12/16/2012   PCOS (polycystic ovarian syndrome) 06/26/2014   Social anxiety disorder 12/16/2012   Past Surgical History:  Procedure Laterality Date   BREAST ENHANCEMENT SURGERY  1999   CESAREAN SECTION     FOOT SURGERY  2001   LEEP     THYROIDECTOMY, PARTIAL  2009   [facility]   Social History   Tobacco Use   Smoking status: Never   Smokeless tobacco: Never  Vaping Use   Vaping Use: Never used  Substance Use Topics   Alcohol use: No    Alcohol/week: 0.0 standard drinks of alcohol   Drug use: No   Family History  Problem Relation Age of Onset   Breast cancer Mother 24   Allergies  Allergen Reactions   Oxycodone Itching and Rash    Other Reaction: Red all over   Shellfish Allergy Other (See  Comments)    Swelling in mouth, ears and hands.   Codeine    Current Outpatient Medications on File Prior to Visit  Medication Sig Dispense Refill   albuterol (VENTOLIN HFA) 108 (90 Base) MCG/ACT inhaler TAKE 2 PUFFS BY MOUTH EVERY 6 HOURS AS NEEDED FOR WHEEZE OR SHORTNESS OF BREATH 18 each 3   ARMOUR THYROID 60 MG tablet Take 60 mg by mouth daily.     atorvastatin (LIPITOR) 20 MG tablet TAKE 1 TABLET (20 MG TOTAL) BY MOUTH DAILY. MUST SEE PCP FOR FURTHER REFILLS. 90 tablet 3   benzonatate (TESSALON) 100 MG capsule Take 1 capsule (100 mg total) by mouth 3 (three) times daily as needed. 30 capsule 0   budesonide (PULMICORT) 0.5 MG/2ML nebulizer solution Take 0.5 mg by nebulization daily.      busPIRone (BUSPAR) 15 MG tablet TAKE 1/2 TABLET (7.5 MG TOTAL) BY MOUTH TWICE A DAY 90 tablet 1   clobetasol cream (TEMOVATE) 2.59 % Apply 1 Application topically 2 (two) times daily. 30 g 2   diclofenac (VOLTAREN) 75 MG EC tablet Take 1 tablet (75 mg total) by mouth 2 (two) times daily. 60 tablet 3   EPINEPHrine (EPIPEN IJ) Inject as directed.     hydrochlorothiazide (HYDRODIURIL) 25 MG tablet Take 1 tablet (25 mg total) by mouth daily. 90 tablet 3   medroxyPROGESTERone (PROVERA) 10 MG tablet Take 10 mg by mouth daily as needed.     metFORMIN (GLUCOPHAGE-XR) 500 MG 24 hr tablet Take 1 tablet (500 mg total) by mouth daily with breakfast. 90 tablet 3   promethazine-dextromethorphan (PROMETHAZINE-DM) 6.25-15 MG/5ML syrup Take 5 mLs by mouth 4 (four) times daily as needed. 118 mL 0   No current facility-administered medications on file prior to visit.   Review of Systems  Constitutional:  Positive for chills, fever and malaise/fatigue.  HENT:  Positive for congestion and sore throat. Negative for ear pain and sinus pain.   Eyes:  Negative for blurred vision, discharge and redness.  Respiratory:  Positive for cough, sputum production and wheezing. Negative for shortness of breath and stridor.   Cardiovascular:  Negative for chest pain, palpitations and leg swelling.  Gastrointestinal:  Positive for nausea. Negative for abdominal pain, diarrhea and vomiting.  Musculoskeletal:  Negative for myalgias.  Skin:  Negative for rash.  Neurological:  Positive for headaches. Negative for dizziness.    Observations/Objective: Patient appears well, in no distress, but is fatigued and lying in bed  Weight is baseline  No facial swelling or asymmetry Voice is mildly hoarse  Sounds very congested  No obvious tremor or mobility impairment Moving neck and UEs normally Able to hear the call well  No audible wheeze or shortness of breath during interview  Occ junky cough  Talkative and mentally sharp with no  cognitive changes No skin changes on face or neck , no rash or pallor Affect is normal    Assessment and Plan: Problem List Items Addressed This Visit       Other   Flu-like symptoms - Primary    Virtual visit today  Clinically I do think this is influenza Disc symptom control  Disc ER precautions   Px prednisone taper for cough /wheeze/congestion - disc side eff and will watch glucose  Tamiflu 5 days  Update if not starting to improve in a week or if worsening  See AVS        Follow Up Instructions: I think you may have the flu   Drink  fluids and rest  mucinex DM is good for cough and congestion  Nasal saline for congestion as needed  Tylenol for fever or pain or headache (also ibuprofen- must take with food)  Please alert [place] if symptoms worsen (if severe or short of breath please go to the ER)   Take tamiflu (anti viral)  Take prednisone for wheezing and congestin   The prometh DM and tessalon are good for cough   Update if not starting to improve in a week or if worsening    I discussed the assessment and treatment plan with the patient. The patient was provided an opportunity to ask questions and all were answered. The patient agreed with the plan and demonstrated an understanding of the instructions.   The patient was advised to call back or seek an in-person evaluation if the symptoms worsen or if the condition fails to improve as anticipated.    Loura Pardon, MD

## 2022-03-02 NOTE — Patient Instructions (Addendum)
I think you may have the flu   Drink fluids and rest  mucinex DM is good for cough and congestion  Nasal saline for congestion as needed  Tylenol for fever or pain or headache (also ibuprofen- must take with food)  Please alert [place] if symptoms worsen (if severe or short of breath please go to the ER)   Take tamiflu (anti viral)  Take prednisone for wheezing and congestin   The prometh DM and tessalon are good for cough   Update if not starting to improve in a week or if worsening

## 2022-03-02 NOTE — Assessment & Plan Note (Signed)
Virtual visit today  Clinically I do think this is influenza Disc symptom control  Disc ER precautions   Px prednisone taper for cough /wheeze/congestion - disc side eff and will watch glucose  Tamiflu 5 days  Update if not starting to improve in a week or if worsening  See AVS

## 2022-03-06 ENCOUNTER — Ambulatory Visit: Payer: BC Managed Care – PPO | Admitting: Pulmonary Disease

## 2022-03-06 ENCOUNTER — Ambulatory Visit: Payer: Self-pay | Admitting: Allergy

## 2022-03-19 ENCOUNTER — Other Ambulatory Visit: Payer: Self-pay | Admitting: Family Medicine

## 2022-03-19 NOTE — Telephone Encounter (Signed)
Last office visit 03/02/2022 (video)  with Dr. Glori Bickers for flu like symptoms.  Last refilled 11/19/21 for #60 with 3 refills by Dr. Lorelei Pont.  Next Appt: 05/07/22 for CPE with Dr. Glori Bickers.

## 2022-03-20 ENCOUNTER — Encounter: Payer: Self-pay | Admitting: Pulmonary Disease

## 2022-03-20 ENCOUNTER — Ambulatory Visit (INDEPENDENT_AMBULATORY_CARE_PROVIDER_SITE_OTHER): Payer: BC Managed Care – PPO | Admitting: Pulmonary Disease

## 2022-03-20 VITALS — BP 122/78 | HR 107 | Ht 67.0 in

## 2022-03-20 DIAGNOSIS — G4733 Obstructive sleep apnea (adult) (pediatric): Secondary | ICD-10-CM | POA: Diagnosis not present

## 2022-03-20 NOTE — Progress Notes (Signed)
Sharon Kerr    [id number]    05-18-1975  Primary Care Physician:Kerr, Sharon Fanny, MD  Referring Physician: Tower, Sharon Fanny, MD Kerr,  Sharon [id number]  Chief complaint:   Patient with severe obstructive sleep apnea in for follow-up  HPI:  Diagnosed with severe obstructive sleep apnea Has been using CPAP  Has had multiple episodes of bronchitis, sinus stuffiness and congestion, stomach bug recently that has limited the use of CPAP  She is tolerating CPAP well Mask fits well Wakes up feeling like she is at a good nights rest when she is able to use it  Recent illness is limited use  Sleep habits remain about the same goes to bed between 11 and 12, wakes up about 4:30 in the morning  Daytime symptoms are better with CPAP use  Weight is down Continues to work on weight loss  Dad did snore  Never smoker  History of hypertension, asthma, diabetes, hyperlipidemia sinus problems  Chronic sinus fullness congestion Did have COVID in December, has had significant nasal stuffiness since then  Evaluated by ENT  Outpatient Encounter Medications as of 03/20/2022  Medication Sig   albuterol (VENTOLIN HFA) 108 (90 Base) MCG/ACT inhaler TAKE 2 PUFFS BY MOUTH EVERY 6 HOURS AS NEEDED FOR WHEEZE OR SHORTNESS OF BREATH   ARMOUR THYROID 60 MG tablet Take 60 mg by mouth daily.   atorvastatin (LIPITOR) 20 MG tablet TAKE 1 TABLET (20 MG TOTAL) BY MOUTH DAILY. MUST SEE PCP FOR FURTHER REFILLS.   budesonide (PULMICORT) 0.5 MG/2ML nebulizer solution Take 0.5 mg by nebulization daily.   busPIRone (BUSPAR) 15 MG tablet TAKE 1/2 TABLET (7.5 MG TOTAL) BY MOUTH TWICE A DAY   clobetasol cream (TEMOVATE) [id number] % Apply 1 Application topically 2 (two) times daily.   diclofenac (VOLTAREN) 75 MG EC tablet TAKE 1 TABLET BY MOUTH TWICE A DAY   EPINEPHrine (EPIPEN IJ) Inject as directed.   hydrochlorothiazide (HYDRODIURIL) 25 MG tablet Take 1 tablet (25 mg total) by mouth  daily.   medroxyPROGESTERone (PROVERA) 10 MG tablet Take 10 mg by mouth daily as needed.   metFORMIN (GLUCOPHAGE-XR) 500 MG 24 hr tablet Take 1 tablet (500 mg total) by mouth daily with breakfast.   benzonatate (TESSALON) 100 MG capsule Take 1 capsule (100 mg total) by mouth 3 (three) times daily as needed.   oseltamivir (TAMIFLU) 75 MG capsule Take 1 capsule (75 mg total) by mouth 2 (two) times daily. (Patient not taking: Reported on 03/20/2022)   predniSONE (DELTASONE) 10 MG tablet Take 4 pills once daily by mouth for 3 days, then 3 pills daily for 3 days, then 2 pills daily for 3 days then 1 pill daily for 3 days then stop (Patient not taking: Reported on 03/20/2022)   promethazine-dextromethorphan (PROMETHAZINE-DM) 6.25-15 MG/5ML syrup Take 5 mLs by mouth 4 (four) times daily as needed. (Patient not taking: Reported on 03/20/2022)   No facility-administered encounter medications on file as of 03/20/2022.    Allergies as of 03/20/2022 - Review Complete 03/20/2022  Allergen Reaction Noted   Oxycodone Itching and Rash 06/25/2014   Shellfish allergy Other (See Comments) 12/29/2021   Codeine  11/23/2007    Past Medical History:  Diagnosis Date   Allergic rhinitis due to pollen    Aortic regurgitation 07/06/2011   Asthma    History of high blood pressure    without diag   Hypothyroidism associated with surgical procedure  Migraine    Panic attacks 12/16/2012   PCOS (polycystic ovarian syndrome) 06/26/2014   Social anxiety disorder 12/16/2012    Past Surgical History:  Procedure Laterality Date   BREAST ENHANCEMENT [place]     FOOT SURGERY  2001   [facility]     THYROIDECTOMY, PARTIAL  2009   [facility]    Family History  Problem Relation Age of Onset   Breast cancer Mother 51    Social History   Socioeconomic History   Marital status: Married    Spouse name: Sharon Kerr   Number of children: 1   Years of education: some college   Highest education level: Not on  file  Occupational History   Occupation: Surveyor, minerals  Tobacco Use   Smoking status: Never   Smokeless tobacco: Never  Vaping Use   Vaping Use: Never used  Substance and Sexual Activity   Alcohol use: No    Alcohol/week: 0.0 standard drinks of alcohol   Drug use: No   Sexual activity: Yes    Birth control/protection: None, Abstinence  Other Topics Concern   Not on file  Social History Narrative   04/17/20   From: [place] moved for husband   Living: with Sharon Kerr, husband (2001) and Sharon Kerr (2009)   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  Respiratory:  Negative for shortness of breath.   Psychiatric/Behavioral:  Positive for sleep disturbance.     Vitals:   03/20/22 1007  BP: 122/78  Pulse: (!) 107  SpO2: 98%     Physical Exam Constitutional:      Appearance: She is obese.  HENT:     Head: Normocephalic.     Mouth/Throat:     Mouth: Mucous membranes are moist.  Eyes:     General: No scleral icterus.    Pupils: Pupils are equal, round, and reactive to light.  Cardiovascular:     Rate and Rhythm: Normal rate and regular rhythm.     Heart sounds: No murmur heard.    No friction rub.  Pulmonary:     Effort: No respiratory distress.     Breath sounds: No stridor. No wheezing or rhonchi.  Musculoskeletal:     Cervical back: No rigidity or tenderness.  Neurological:     Mental Status: She is alert.   Psychiatric:        Mood and Affect: Mood normal.       12/01/2021    1:00 PM  Results of the Epworth flowsheet  Sitting and reading 3  Watching TV 3  Sitting, inactive in a public place (e.g. a theatre or a meeting) 0  As a passenger in a car for an hour without a break 3  Lying down to rest in  the afternoon when circumstances permit 0  Sitting and talking to someone 0  Sitting quietly after a lunch without alcohol 3  In a car, while stopped for a few minutes in traffic 3  Total score 15     Data Reviewed: Compliance data reviewed showing 50% compliance Average use of 5 hours 16 minutes AutoSet 5-20 Residual AHI of 8.4  Assessment:  Severe obstructive sleep apnea  Trying to be compliant with CPAP use -Has had some health problems recently limiting CPAP use  -Appears to be tolerating CPAP well  Continuing to work on weight loss efforts  Plan/Recommendations: Continue CPAP nightly  Call us with significant concerns  Follow-up in 3 months  Sherrilyn Rist MD [facility] 03/20/2022, 10:15 AM  CC: Kerr, Sharon Fanny, MD

## 2022-03-20 NOTE — Patient Instructions (Signed)
Continue using your CPAP nightly  I will see you back in about 3 months  Call with significant concerns

## 2022-03-25 ENCOUNTER — Ambulatory Visit (INDEPENDENT_AMBULATORY_CARE_PROVIDER_SITE_OTHER): Payer: BC Managed Care – PPO | Admitting: Allergy

## 2022-03-25 ENCOUNTER — Encounter: Payer: Self-pay | Admitting: Allergy

## 2022-03-25 VITALS — BP 130/80 | HR 95 | Temp 98.0°F | Resp 18 | Ht 67.0 in

## 2022-03-25 DIAGNOSIS — L500 Allergic urticaria: Secondary | ICD-10-CM | POA: Diagnosis not present

## 2022-03-25 DIAGNOSIS — J329 Chronic sinusitis, unspecified: Secondary | ICD-10-CM

## 2022-03-25 DIAGNOSIS — J4520 Mild intermittent asthma, uncomplicated: Secondary | ICD-10-CM

## 2022-03-25 MED ORDER — RYALTRIS 665-25 MCG/ACT NA SUSP: 1.0000 | Freq: Two times a day (BID) | NASAL | 5 refills | Status: DC

## 2022-03-25 MED ORDER — LEVOCETIRIZINE DIHYDROCHLORIDE 5 MG PO TABS: 5.0000 mg | ORAL_TABLET | Freq: Every evening | ORAL | 5 refills | Status: DC

## 2022-03-25 MED ORDER — OLOPATADINE HCL 0.2 % OP SOLN: 1.0000 [drp] | Freq: Every day | OPHTHALMIC | 5 refills | Status: DC

## 2022-03-25 NOTE — Patient Instructions (Addendum)
-  skin testing today is positive to lobster (this is a crustacean and crustaceans are cross-reactive - lobster, shrimp, crab).   Fish panel is negative -continue avoidance of  -have access to self-injectable epinephrine (Epipen or AuviQ) 0.51m at all times -follow emergency action plan in case of allergic reaction  - will obtain immunocompetence screen due to recurrent illnesses (sinus infections, respiratory tract illnesses)  - will obtain environmental allergy panel via bloodwork - if having itchy/watery eyes can use eyedrop like Pataday 1 drop each eye daily as needed - for nasal congestion/drainage can use Ryaltris spray 2 sprays each nostril twice a day as needed.  This is combination spray with mometasone for congestion control and olopatadine for drainage control -for general allergy symptoms can take Xyzal 579mdaily as needed  -should significant symptoms recur or new symptoms occur, a journal is to be kept recording any foods eaten, beverages consumed, medications taken, activities performed, and environmental conditions within a 6 hour time period prior to the onset of symptoms. For any symptoms concerning for anaphylaxis, epinephrine is to be administered and 911 is to be called immediately.  Follow-up in 4-6 months or sooner if needed

## 2022-03-25 NOTE — Progress Notes (Signed)
New Patient Note  RE: [name] MRN: [id number] DOB: [birth year] Date of Office Visit: 03/25/2022  Primary care provider: Abner Greenspan, MD  Chief Complaint: food allergy  History of present illness: Sharon Kerr is a 47 y.o. female presenting today for evaluation of food allergy.    Around Christmas she ate a piece of fish with crab on top and immediately she broke out in big welts over the stomach within 15 minutes of ingestion.  She started taking benadryl right away.  She states the welts lasted for about 4-5 days.  [place] and [place].  She did wait until a UC opened about 5 days later.  She states she ended up having lip and ear swelling as well as the hives that started later in the 5 day course.   The UC gave her a steroid injection and an epipen prescription which she does have.  She denies any further shellfish/fish the rest of that week and has not had any since.   She states around Thanksgiving her aunt made a fish soup (fish, shellfish with seafood broth) and she states she had diarrhea afterwards and she was the only person that had this issue.  She states she would maybe eat seafood 1-2 times a month.  She denies having hives before the christmas dish.  She states as a child she had a reaction to fruitloops similar to the christmas dish with large hives.    She states after having covid about 2 years ago she has been getting recurrent sinus infections and this past year has been awful with almost monthly treated with steroids and antibiotics for sinus infection.   She states she saw an ENT and had allergy testing and was positive to everything (pollens, molds, dust mites cockroaches).  She states she did do allergy shots twice a week for about 4-5 months but felt like she was getting worse and thus stopped.   She states if she is in a dusty environment or around cats she can have more allergy  symptoms which can include sneezing, nasal congestion or drainage, itchy watery eyes.  She states she can start wheezing (will use albuterol as needed) and she is not sure what triggers this.  She does note however illnesses seem to make it worse.  She does report history of asthma.   In Jan 2024 she had bacterial bronchitis illness followed by influenzaA and gastroenteritis.  She states with the flu she had a lot of wheezing.  Because she had very recently completed antibiotic for the bacterial bronchitis she did not receive any specific treatment for the flu other than over-the-counter medications.  Review of systems in the past 4 weeks: Review of Systems  Constitutional: Negative.   HENT: Negative.    Eyes: Negative.   Respiratory: Negative.    Cardiovascular: Negative.   Gastrointestinal: Negative.   Musculoskeletal: Negative.   Skin: Negative.   Allergic/Immunologic: Negative.   Neurological: Negative.     All other systems negative unless noted above in HPI  Past medical history: Past Medical History:  Diagnosis Date   Allergic rhinitis due to pollen    Aortic regurgitation 07/06/2011   Asthma    History of high blood pressure    without diag   Hypothyroidism associated with surgical procedure    Migraine    Panic attacks 12/16/2012   PCOS (polycystic ovarian syndrome) 06/26/2014   Social  anxiety disorder 12/16/2012    Past surgical history: Past Surgical History:  Procedure Laterality Date   BREAST ENHANCEMENT SURGERY  1999   [facility]     FOOT SURGERY  2001   [facility]     THYROIDECTOMY, PARTIAL  2009   [facility]    Family history:  Family History  Problem Relation Age of Onset   Breast cancer Mother 78    Social history: Lives in a home without carpeting with electric heating and central cooling.  Dog in the home.  There is no concern for water damage, mildew or roaches in the home.  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  She works on [employer] and does this work.  She  denies a smoking history.   Medication List: Current Outpatient Medications  Medication Sig Dispense Refill   busPIRone (BUSPAR) 15 MG tablet TAKE 1/2 TABLET (7.5 MG TOTAL) BY MOUTH TWICE A DAY 90 tablet 1   EPINEPHrine (EPIPEN IJ) Inject as directed.     hydrochlorothiazide (HYDRODIURIL) 25 MG tablet Take 1 tablet (25 mg total) by mouth daily. 90 tablet 3   levocetirizine (XYZAL) 5 MG tablet Take 1 tablet (5 mg total) by mouth every evening. 30 tablet 5   Olopatadine HCl (PATADAY) 0.2 % SOLN Place 1 drop into both eyes daily. 2.5 mL 5   Olopatadine-Mometasone (RYALTRIS) 665-25 MCG/ACT SUSP Place 1 spray into both nostrils 2 (two) times daily. 29 g 5   No current facility-administered medications for this visit.    Known medication allergies: Allergies  Allergen Reactions   Oxycodone Itching and Rash    Other Reaction: Red all over   Shellfish Allergy Other (See Comments)    Swelling in mouth, ears and hands.   Codeine      Physical examination: Blood pressure 130/80, pulse 95, temperature 98 F (36.7 C), temperature source Temporal, resp. rate 18, height 5' 7"$  (1.702 m), SpO2 96 %.  General: Alert, interactive, in no acute distress. HEENT: PERRLA, TMs pearly gray, turbinates non-edematous without discharge, post-pharynx non erythematous. Neck: Supple without lymphadenopathy. Lungs: Clear to auscultation without wheezing, rhonchi or rales. {no increased work of breathing. CV: Normal S1, S2 without murmurs. Abdomen: Nondistended, nontender. Skin: Warm and dry, without lesions or rashes. Extremities:  No clubbing, cyanosis or edema. Neuro:   Grossly intact.  Diagnositics/Labs:  Spirometry: FEV1: 2.78L 87%, FVC: 3.21L 81%, ratio consistent with nonobstructive pattern  Allergy testing:   Food Adult Perc - 03/25/22 1500     Time Antigen Placed 1530    Allergen Manufacturer Lavella Hammock    Location Arm    Number of allergen test 14     Control-buffer 50% Glycerol Negative     Control-Histamine 1 mg/ml 2+    18. Catfish Negative    19. Bass Negative    20. Trout Negative    21. Tuna Negative    22. Salmon Negative    23. Flounder Negative    24. Codfish Negative    25. Shrimp Negative    26. Crab Negative    27. Lobster 2+    28. Oyster Negative    29. Scallops Negative             Allergy testing results were read and interpreted by provider, documented by clinical staff.   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  Fish panel is negative -continue avoidance of  -have access to self-injectable epinephrine (Epipen or AuviQ) 0.57m at all times -follow emergency action plan in case of allergic reaction  - will obtain immunocompetence screen due to recurrent illnesses (sinus infections, respiratory tract illnesses)  - will obtain environmental allergy panel via bloodwork - if having itchy/watery eyes can use eyedrop like Pataday 1 drop each eye daily as needed - for nasal congestion/drainage can use Ryaltris spray 2 sprays each nostril twice a day as needed.  This is combination spray with mometasone for congestion control and olopatadine for drainage control -for general allergy symptoms can take Xyzal 572mdaily as needed  -should significant symptoms recur or new symptoms occur, a journal is to be kept recording any foods eaten, beverages consumed, medications taken, activities performed, and environmental conditions within a 6 hour time period prior to the onset of symptoms. For any symptoms concerning for anaphylaxis, epinephrine is to be administered and 911 is to be called immediately.  Follow-up in 4-6 months or sooner if needed   I appreciate the opportunity to take part in Rory's care. Please do not hesitate to contact me with  questions.  Sincerely,   ShPrudy FeelerMD [facility] [place]

## 2022-03-26 IMAGING — CR DG CHEST 2V
1 series · 2 of 2 positions shown · non-contrast
Comparison: None.

CLINICAL DATA: chest pain

EXAM:
CHEST - 2 VIEW

[Series 1: dg chest 2 view · 0.14mm/px · 2 of 2 slices shown]
[im 1/2]
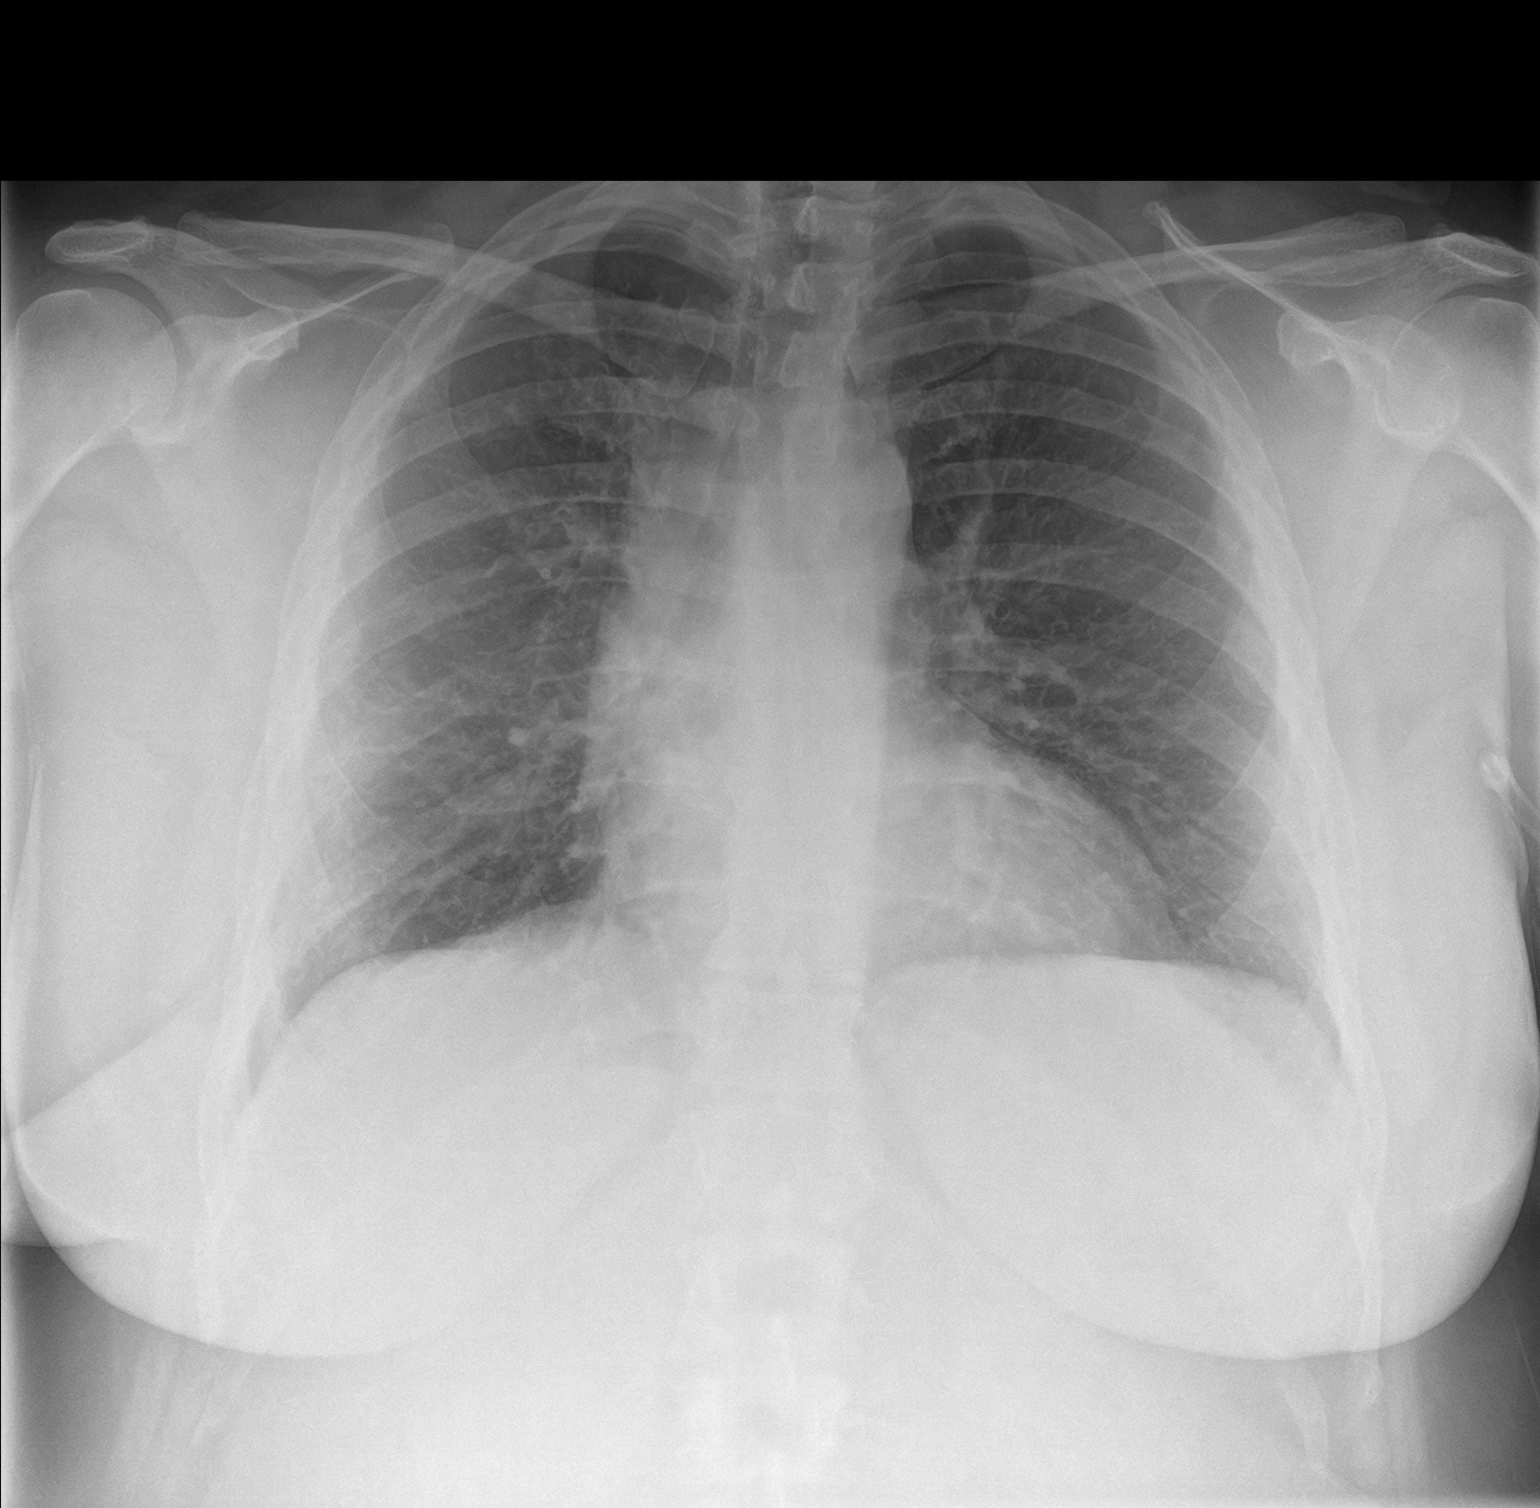
[im 2/2]
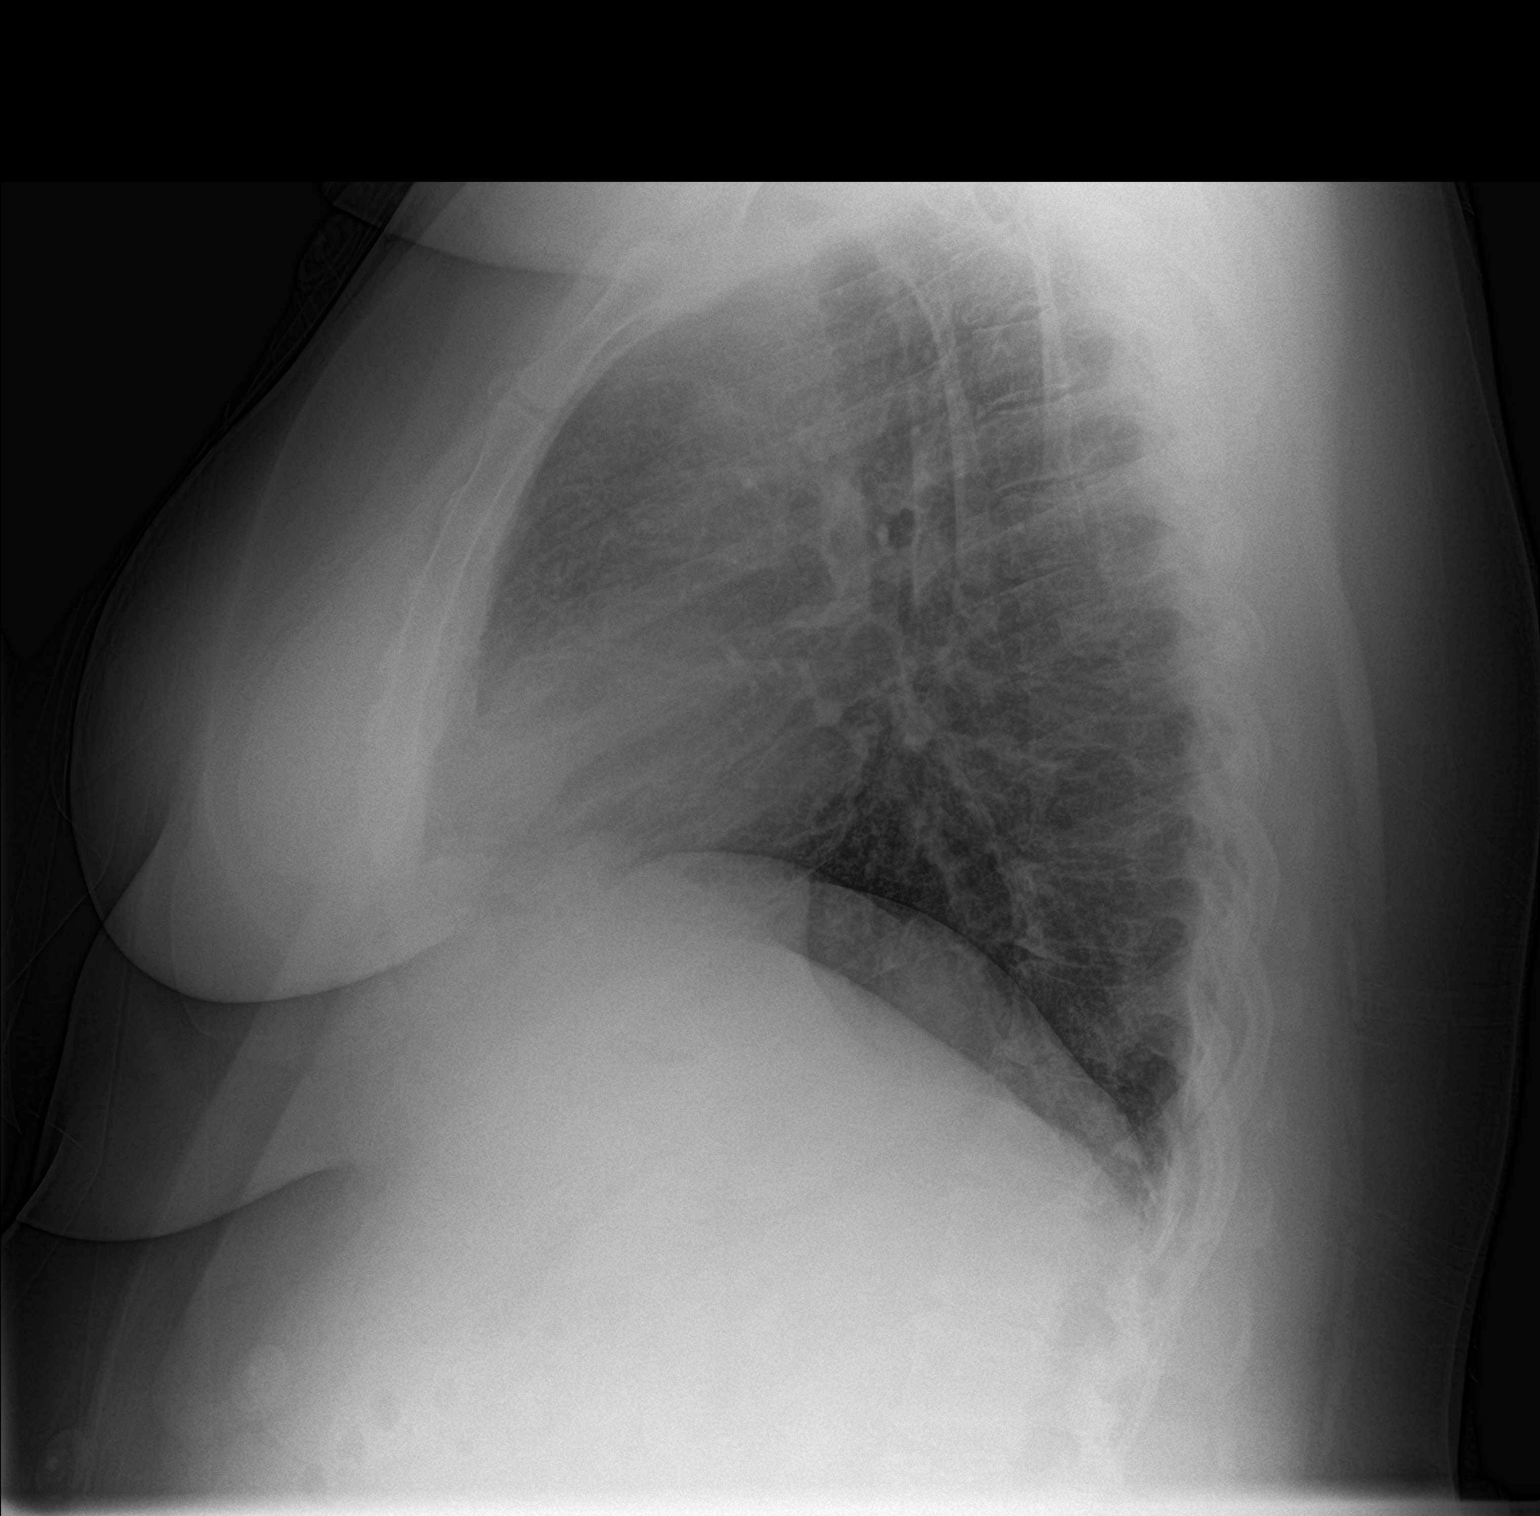

[2 of 2 positions shown; findings below may reference images not displayed]

FINDINGS: The heart and mediastinal contours are within normal limits.

No focal consolidation. No pulmonary edema. No pleural effusion. No
pneumothorax.

No acute osseous abnormality.
IMPRESSION: No active cardiopulmonary disease.

## 2022-04-04 LAB — STREP PNEUMONIAE 23 SEROTYPES IGG
Pneumo Ab Type 1*: 0.3 ug/mL — ABNORMAL LOW (ref >1.3)
Pneumo Ab Type 12 (12F)*: 0.4 ug/mL — ABNORMAL LOW (ref >1.3)
Pneumo Ab Type 14*: 0.2 ug/mL — ABNORMAL LOW (ref >1.3)
Pneumo Ab Type 17 (17F)*: 0.1 ug/mL — ABNORMAL LOW (ref >1.3)
Pneumo Ab Type 19 (19F)*: 0.2 ug/mL — ABNORMAL LOW (ref >1.3)
Pneumo Ab Type 2*: <0.2 ug/mL — ABNORMAL LOW (ref >1.3)
Pneumo Ab Type 20*: 0.6 ug/mL — ABNORMAL LOW (ref >1.3)
Pneumo Ab Type 22 (22F)*: 0.1 ug/mL — ABNORMAL LOW (ref >1.3)
Pneumo Ab Type 23 (23F)*: <0.1 ug/mL — ABNORMAL LOW (ref >1.3)
Pneumo Ab Type 26 (6B)*: <0.1 ug/mL — ABNORMAL LOW (ref >1.3)
Pneumo Ab Type 3*: 0.2 ug/mL — ABNORMAL LOW (ref >1.3)
Pneumo Ab Type 34 (10A)*: <0.1 ug/mL — ABNORMAL LOW (ref >1.3)
Pneumo Ab Type 4*: <0.1 ug/mL — ABNORMAL LOW (ref >1.3)
Pneumo Ab Type 43 (11A)*: 0.2 ug/mL — ABNORMAL LOW (ref >1.3)
Pneumo Ab Type 5*: 1 ug/mL — ABNORMAL LOW (ref >1.3)
Pneumo Ab Type 51 (7F)*: 0.1 ug/mL — ABNORMAL LOW (ref >1.3)
Pneumo Ab Type 54 (15B)*: <0.2 ug/mL — ABNORMAL LOW (ref >1.3)
Pneumo Ab Type 56 (18C)*: <0.1 ug/mL — ABNORMAL LOW (ref >1.3)
Pneumo Ab Type 57 (19A)*: 0.4 ug/mL — ABNORMAL LOW (ref >1.3)
Pneumo Ab Type 68 (9V)*: <0.1 ug/mL — ABNORMAL LOW (ref >1.3)
Pneumo Ab Type 70 (33F)*: 6.5 ug/mL (ref >1.3)
Pneumo Ab Type 8*: <0.3 ug/mL — ABNORMAL LOW (ref >1.3)
Pneumo Ab Type 9 (9N)*: <0.1 ug/mL — ABNORMAL LOW (ref >1.3)

## 2022-04-04 LAB — CBC WITH DIFFERENTIAL
Basophils Absolute: 0.1 10*3/uL (ref 0.0–0.2)
Basos: 1 %
EOS (ABSOLUTE): 0.4 10*3/uL (ref 0.0–0.4)
Eos: 4 %
Hematocrit: 42.6 % (ref 34.0–46.6)
Hemoglobin: 14.4 g/dL (ref 11.1–15.9)
Immature Grans (Abs): 0 10*3/uL (ref 0.0–0.1)
Immature Granulocytes: 0 %
Lymphocytes Absolute: 2.9 10*3/uL (ref 0.7–3.1)
Lymphs: 29 %
MCH: 29.5 pg (ref 26.6–33.0)
MCHC: 33.8 g/dL (ref 31.5–35.7)
MCV: 87 fL (ref 79–97)
Monocytes Absolute: 0.9 10*3/uL (ref 0.1–0.9)
Monocytes: 9 %
Neutrophils Absolute: 5.7 10*3/uL (ref 1.4–7.0)
Neutrophils: 57 %
RBC: 4.88 x10E6/uL (ref 3.77–5.28)
RDW: 13.7 % (ref 11.7–15.4)
WBC: 9.9 10*3/uL (ref 3.4–10.8)

## 2022-04-04 LAB — ALLERGEN PROFILE, SHELLFISH
Clam IgE: <0.1 kU/L
F023-IgE Crab: <0.1 kU/L
F080-IgE Lobster: <0.1 kU/L
F290-IgE Oyster: <0.1 kU/L
Scallop IgE: <0.1 kU/L
Shrimp IgE: <0.1 kU/L

## 2022-04-04 LAB — ALLERGEN PROFILE, FOOD-FISH
Allergen Mackerel IgE: <0.1 kU/L
Allergen Salmon IgE: <0.1 kU/L
Allergen Trout IgE: <0.1 kU/L
Allergen Walley Pike IgE: <0.1 kU/L
Codfish IgE: <0.1 kU/L
Halibut IgE: <0.1 kU/L
Tuna: <0.1 kU/L

## 2022-04-04 LAB — COMPLEMENT, TOTAL: Compl, Total (CH50): >60 U/mL (ref >41)

## 2022-04-04 LAB — DIPHTHERIA / TETANUS ANTIBODY PANEL
Diphtheria Ab: 0.11 IU/mL (ref <0.10)
Tetanus Ab, IgG: 0.88 IU/mL (ref <0.10)

## 2022-04-04 LAB — IGG, IGA, IGM
IgA/Immunoglobulin A, Serum: 303 mg/dL (ref 87–352)
IgG (Immunoglobin G), Serum: 644 mg/dL (ref 586–1602)
IgM (Immunoglobulin M), Srm: 63 mg/dL (ref 26–217)

## 2022-04-14 ENCOUNTER — Ambulatory Visit: Payer: BC Managed Care – PPO | Admitting: Family Medicine

## 2022-04-14 ENCOUNTER — Ambulatory Visit (INDEPENDENT_AMBULATORY_CARE_PROVIDER_SITE_OTHER)
Admission: RE | Admit: 2022-04-14 | Discharge: 2022-04-14 | Disposition: A | Discharge status: Home / Self Care | Payer: BC Managed Care – PPO | Source: Ambulatory Visit | Attending: Family Medicine | Admitting: Family Medicine

## 2022-04-14 ENCOUNTER — Encounter: Payer: Self-pay | Admitting: Family Medicine

## 2022-04-14 VITALS — BP 116/72 | HR 90 | Temp 97.9°F | Ht 67.0 in | Wt 310.0 lb

## 2022-04-14 DIAGNOSIS — M25572 Pain in left ankle and joints of left foot: Secondary | ICD-10-CM | POA: Diagnosis not present

## 2022-04-14 MED ORDER — MELOXICAM 15 MG PO TABS: 15.0000 mg | ORAL_TABLET | Freq: Every day | ORAL | 0 refills | Status: DC | PRN

## 2022-04-14 NOTE — Patient Instructions (Signed)
Ice the ankle/foot for 10 minutes when you can  Also the knee or knees   Take meloxicam with food daily as needed   Wear the most supportive shoe or boot   Elevate your foot when you can   Relative rest until this feels better  Update if not starting to improve in a week or if worsening - would consider follow up with Dr Lorelei Pont

## 2022-04-14 NOTE — Assessment & Plan Note (Addendum)
Pt has pain in L anterior ankle (where it meets with foot)  Point tender in that area only  No fractures on foot and ankle xray today  Suspect tendon strain/injury-poss from exercise  If stress fracture however this may not show up until later She also has some chronic knee pain that change gait (though improved with wt loss)  Disc plan for elevation and ice  Relative rest from strenuous activity  Wear most supportive shoe or boot  Meloxicam 15 mg with food daily prn Update if not starting to improve in a week or if worsening   Consider sport med ref if not improved

## 2022-04-14 NOTE — Progress Notes (Signed)
Subjective:    Patient ID: Sharon Kerr, female    DOB: Jul 04, 1975, 47 y.o.   MRN: [id number]  HPI Pt presents with left ankle and foot pain   Wt Readings from Last 3 Encounters:  04/14/22 (!) 310 lb (140.6 kg)  07/03/21 (!) 320 lb (145.2 kg)  02/06/21 (!) 330 lb (149.7 kg)    48.55 kg/m  Refuses weight today because she weighted at home   310 at home   Vitals:   04/14/22 1546  BP: 116/72  Pulse: 90  Temp: 97.9 F (36.6 C)  SpO2: 97%      Does cross fit  Did sled push/pull on Friday  Woke up Friday am with pain on front of her foot  Hard to tell if swollen   Hurts with certain movements - hurts to point toe  Limps a little to walk on it   Dull achey pain  No ice  Has not elevated- works with feet down   Otc Took 2 ibuprofen last night- helped just a little bit   Has a chronic r knee problem also  Bothered her when it was raining   Arthritis in both knees     Remote h/o gout  Lab Results  Component Value Date   LABURIC 6.8 **Please note change in reference range.** 03/01/2007    Xr today DG Ankle Complete Left  Result Date: 04/14/2022 CLINICAL DATA:  ankle and foot pain after injury; pain in L ankle and foot after injury EXAM: LEFT ANKLE COMPLETE - 3+ VIEW; LEFT FOOT - COMPLETE 3+ VIEW COMPARISON:  None Available. FINDINGS: Ankle: There is no evidence of acute fracture. Alignment is normal. Well corticated bony fragments adjacent to the tip of the medial malleolus compatible with old medial ankle injury. Os peroneum. Plantar and dorsal calcaneal spurring. Calcifications along the proximal plantar fascia. There is prominent soft tissue swelling. Left foot: Prior fifth metatarsal osteotomy with screw fixation. No evidence of hardware loosening. Probable postsurgical changes to the fifth toe proximal phalanx with mild to moderate PIP arthritis. There is prominent soft tissue swelling. IMPRESSION: Soft tissue swelling of the ankle and foot. No acute  osseous abnormality identified. Chronic postsurgical changes at the distal fifth metatarsal and fifth toe proximal phalanx. Electronically Signed   By: Maurine Simmering M.D.   On: 04/14/2022 16:15   DG Foot Complete Left  Result Date: 04/14/2022 CLINICAL DATA:  ankle and foot pain after injury; pain in L ankle and foot after injury EXAM: LEFT ANKLE COMPLETE - 3+ VIEW; LEFT FOOT - COMPLETE 3+ VIEW COMPARISON:  None Available. FINDINGS: Ankle: There is no evidence of acute fracture. Alignment is normal. Well corticated bony fragments adjacent to the tip of the medial malleolus compatible with old medial ankle injury. Os peroneum. Plantar and dorsal calcaneal spurring. Calcifications along the proximal plantar fascia. There is prominent soft tissue swelling. Left foot: Prior fifth metatarsal osteotomy with screw fixation. No evidence of hardware loosening. Probable postsurgical changes to the fifth toe proximal phalanx with mild to moderate PIP arthritis. There is prominent soft tissue swelling. IMPRESSION: Soft tissue swelling of the ankle and foot. No acute osseous abnormality identified. Chronic postsurgical changes at the distal fifth metatarsal and fifth toe proximal phalanx. Electronically Signed   By: Maurine Simmering M.D.   On: 04/14/2022 16:15    Patient Active Problem List   Diagnosis Date Noted   Left ankle pain 04/14/2022   Flu-like symptoms 03/02/2022   Psoriasis 01/10/2022   Left  knee injury, initial encounter 01/10/2022   Shellfish allergy 12/31/2021   Snoring 11/11/2021   Apnea 11/11/2021   Wheezing 03/04/2021   Type 2 diabetes mellitus without complication, without long-term current use of insulin ([place]) 01/11/2020   Allergic rhinitis due to animal hair and dander 01/11/2020   Essential hypertension, benign 02/07/2016   PCOS (polycystic ovarian syndrome) 06/26/2014   Hypothyroidism associated with surgical procedure    Social anxiety disorder 12/16/2012   Panic attacks 12/16/2012   Allergic  rhinitis due to pollen    Thyroid nodule 07/08/2011   Morbid obesity ([place]) 07/08/2011   Aortic regurgitation 07/06/2011   ACUTE GOUTY ARTHROPATHY 10/25/2009   Asthma 11/23/2007   Past Medical History:  Diagnosis Date   Allergic rhinitis due to pollen    Aortic regurgitation 07/06/2011   Asthma    History of high blood pressure    without diag   Hypothyroidism associated with surgical procedure    Migraine    Panic attacks 12/16/2012   PCOS (polycystic ovarian syndrome) 06/26/2014   Social anxiety disorder 12/16/2012   Past Surgical History:  Procedure Laterality Date   [place]  2001   LEEP     THYROIDECTOMY, PARTIAL  2009   [facility]   Social History   Tobacco Use   Smoking status: Never   Smokeless tobacco: Never  Vaping Use   Vaping Use: Never used  Substance Use Topics   Alcohol use: No    Alcohol/week: 0.0 standard drinks of alcohol   Drug use: No   Family History  Problem Relation Age of Onset   Breast cancer Mother 11   Allergies  Allergen Reactions   Oxycodone Itching and Rash    Other Reaction: Red all over   Codeine    Current Outpatient Medications on File Prior to Visit  Medication Sig Dispense Refill   EPINEPHrine (EPIPEN IJ) Inject as directed daily as needed.     hydrochlorothiazide (HYDRODIURIL) 25 MG tablet Take 1 tablet (25 mg total) by mouth daily. 90 tablet 3   levocetirizine (XYZAL) 5 MG tablet Take 1 tablet (5 mg total) by mouth every evening. 30 tablet 5   Olopatadine-Mometasone (RYALTRIS) 665-25 MCG/ACT SUSP Place 1 spray into both nostrils 2 (two) times daily. 29 g 5   No current facility-administered medications on file prior to visit.    Review of Systems  Constitutional:  Negative for activity change, appetite change, fatigue, fever and unexpected weight change.  HENT:  Negative for congestion, ear pain, rhinorrhea, sinus pressure and sore throat.   Eyes:  Negative for pain,  redness and visual disturbance.  Respiratory:  Negative for cough, shortness of breath and wheezing.   Cardiovascular:  Negative for chest pain and palpitations.  Gastrointestinal:  Negative for abdominal pain, blood in stool, constipation and diarrhea.  Endocrine: Negative for polydipsia and polyuria.  Genitourinary:  Negative for dysuria, frequency and urgency.  Musculoskeletal:  Positive for arthralgias. Negative for back pain and myalgias.  Skin:  Negative for pallor and rash.  Allergic/Immunologic: Negative for environmental allergies.  Neurological:  Negative for dizziness, syncope and headaches.  Hematological:  Negative for adenopathy. Does not bruise/bleed easily.  Psychiatric/Behavioral:  Negative for decreased concentration and dysphoric mood. The patient is not nervous/anxious.        Objective:   Physical Exam Constitutional:      General: She is not in acute distress.    Appearance: Normal  appearance. She is obese. She is not ill-appearing.  HENT:     Head: Normocephalic and atraumatic.  Eyes:     Conjunctiva/sclera: Conjunctivae normal.     Pupils: Pupils are equal, round, and reactive to light.  Cardiovascular:     Rate and Rhythm: Normal rate and regular rhythm.     Heart sounds: Normal heart sounds.  Pulmonary:     Effort: Pulmonary effort is normal. No respiratory distress.  Musculoskeletal:     Cervical back: Neck supple. No rigidity.     Comments: Mild swelling of both ankles  Some dorsa foot swelling-L foot  Tender over anterior ankle but not either malleolus  No crepitus Pain with plantar flexion and int rotation of ankle  No bony tenderness of foot /nl rom of foot and toes  Gait favors RLE today    Neurological:     Mental Status: She is alert.     Sensory: No sensory deficit.     Motor: No weakness.  Psychiatric:        Mood and Affect: Mood normal.           Assessment & Plan:   Problem List Items Addressed This Visit       Other    Left ankle pain - Primary    Pt has pain in L anterior ankle (where it meets with foot)  Point tender in that area only  No fractures on foot and ankle xray today  Suspect tendon strain/injury-poss from exercise  If stress fracture however this may not show up until later She also has some chronic knee pain that change gait (though improved with wt loss)  Disc plan for elevation and ice  Relative rest from strenuous activity  Wear most supportive shoe or boot  Meloxicam 15 mg with food daily prn Update if not starting to improve in a week or if worsening   Consider sport med ref if not improved       Relevant Orders   DG Ankle Complete Left (Completed)   DG Foot Complete Left (Completed)   Morbid obesity ([place])    Continues to make progress with diet and exercise  Commended

## 2022-04-14 NOTE — Assessment & Plan Note (Signed)
Continues to make progress with diet and exercise  Commended

## 2022-04-28 ENCOUNTER — Telehealth: Payer: Self-pay | Admitting: Family Medicine

## 2022-04-28 DIAGNOSIS — E890 Postprocedural hypothyroidism: Secondary | ICD-10-CM

## 2022-04-28 DIAGNOSIS — E119 Type 2 diabetes mellitus without complications: Secondary | ICD-10-CM

## 2022-04-28 DIAGNOSIS — I10 Essential (primary) hypertension: Secondary | ICD-10-CM

## 2022-04-28 NOTE — Telephone Encounter (Signed)
-----   Message from Ellamae Sia sent at 04/14/2022  9:46 AM EDT ----- Regarding: Lab orders for Thursday, 3.28.24 Patient is scheduled for CPX labs, please order future labs, Thanks , Karna Christmas

## 2022-04-30 ENCOUNTER — Other Ambulatory Visit (INDEPENDENT_AMBULATORY_CARE_PROVIDER_SITE_OTHER): Payer: BC Managed Care – PPO

## 2022-04-30 DIAGNOSIS — E119 Type 2 diabetes mellitus without complications: Secondary | ICD-10-CM | POA: Diagnosis not present

## 2022-04-30 DIAGNOSIS — I10 Essential (primary) hypertension: Secondary | ICD-10-CM | POA: Diagnosis not present

## 2022-04-30 DIAGNOSIS — E890 Postprocedural hypothyroidism: Secondary | ICD-10-CM | POA: Diagnosis not present

## 2022-04-30 LAB — HEMOGLOBIN A1C: Hgb A1c MFr Bld: 6.2 % (ref 4.6–6.5)

## 2022-04-30 LAB — LIPID PANEL
Cholesterol: 168 mg/dL (ref 0–200)
HDL: 31.4 mg/dL — ABNORMAL LOW (ref >39.00)
LDL Cholesterol: 119 mg/dL — ABNORMAL HIGH (ref 0–99)
NonHDL: 136.92
Total CHOL/HDL Ratio: 5
Triglycerides: 91 mg/dL (ref 0.0–149.0)
VLDL: 18.2 mg/dL (ref 0.0–40.0)

## 2022-04-30 LAB — COMPREHENSIVE METABOLIC PANEL
ALT: 26 U/L (ref 0–35)
AST: 16 U/L (ref 0–37)
Albumin: 4 g/dL (ref 3.5–5.2)
Alkaline Phosphatase: 53 U/L (ref 39–117)
BUN: 16 mg/dL (ref 6–23)
CO2: 25 mEq/L (ref 19–32)
Calcium: 9.6 mg/dL (ref 8.4–10.5)
Chloride: 105 mEq/L (ref 96–112)
Creatinine, Ser: 0.69 mg/dL (ref 0.40–1.20)
GFR: 103.67 mL/min (ref >60.00)
Glucose, Bld: 107 mg/dL — ABNORMAL HIGH (ref 70–99)
Potassium: 4.3 mEq/L (ref 3.5–5.1)
Sodium: 139 mEq/L (ref 135–145)
Total Bilirubin: 1.3 mg/dL — ABNORMAL HIGH (ref 0.2–1.2)
Total Protein: 6.7 g/dL (ref 6.0–8.3)

## 2022-04-30 LAB — CBC WITH DIFFERENTIAL/PLATELET
Basophils Absolute: 0.1 10*3/uL (ref 0.0–0.1)
Basophils Relative: 0.8 % (ref 0.0–3.0)
Eosinophils Absolute: 0.4 10*3/uL (ref 0.0–0.7)
Eosinophils Relative: 6.6 % — ABNORMAL HIGH (ref 0.0–5.0)
HCT: 43.9 % (ref 36.0–46.0)
Hemoglobin: 14.9 g/dL (ref 12.0–15.0)
Lymphocytes Relative: 38.6 % (ref 12.0–46.0)
Lymphs Abs: 2.6 10*3/uL (ref 0.7–4.0)
MCHC: 33.9 g/dL (ref 30.0–36.0)
MCV: 88.1 fl (ref 78.0–100.0)
Monocytes Absolute: 0.5 10*3/uL (ref 0.1–1.0)
Monocytes Relative: 7.1 % (ref 3.0–12.0)
Neutro Abs: 3.2 10*3/uL (ref 1.4–7.7)
Neutrophils Relative %: 46.9 % (ref 43.0–77.0)
Platelets: 230 10*3/uL (ref 150.0–400.0)
RBC: 4.99 Mil/uL (ref 3.87–5.11)
RDW: 14.7 % (ref 11.5–15.5)
WBC: 6.8 10*3/uL (ref 4.0–10.5)

## 2022-04-30 LAB — MICROALBUMIN / CREATININE URINE RATIO
Creatinine,U: 73.9 mg/dL
Microalb Creat Ratio: 0.9 mg/g (ref 0.0–30.0)
Microalb, Ur: <0.7 mg/dL (ref 0.0–1.9)

## 2022-04-30 LAB — TSH: TSH: 1.45 u[IU]/mL (ref 0.35–5.50)

## 2022-05-07 ENCOUNTER — Ambulatory Visit (INDEPENDENT_AMBULATORY_CARE_PROVIDER_SITE_OTHER): Payer: BC Managed Care – PPO | Admitting: Family Medicine

## 2022-05-07 ENCOUNTER — Encounter: Payer: Self-pay | Admitting: Family Medicine

## 2022-05-07 VITALS — BP 128/78 | HR 83 | Temp 97.5°F | Ht 67.0 in | Wt 298.0 lb

## 2022-05-07 DIAGNOSIS — R7303 Prediabetes: Secondary | ICD-10-CM

## 2022-05-07 DIAGNOSIS — I10 Essential (primary) hypertension: Secondary | ICD-10-CM | POA: Diagnosis not present

## 2022-05-07 DIAGNOSIS — Z1211 Encounter for screening for malignant neoplasm of colon: Secondary | ICD-10-CM

## 2022-05-07 DIAGNOSIS — Z0000 Encounter for general adult medical examination without abnormal findings: Secondary | ICD-10-CM | POA: Diagnosis not present

## 2022-05-07 DIAGNOSIS — E282 Polycystic ovarian syndrome: Secondary | ICD-10-CM | POA: Diagnosis not present

## 2022-05-07 DIAGNOSIS — J301 Allergic rhinitis due to pollen: Secondary | ICD-10-CM

## 2022-05-07 DIAGNOSIS — E890 Postprocedural hypothyroidism: Secondary | ICD-10-CM | POA: Diagnosis not present

## 2022-05-07 DIAGNOSIS — Z23 Encounter for immunization: Secondary | ICD-10-CM

## 2022-05-07 DIAGNOSIS — E785 Hyperlipidemia, unspecified: Secondary | ICD-10-CM | POA: Insufficient documentation

## 2022-05-07 DIAGNOSIS — G4733 Obstructive sleep apnea (adult) (pediatric): Secondary | ICD-10-CM

## 2022-05-07 NOTE — Assessment & Plan Note (Signed)
Reviewed health habits including diet and exercise and skin cancer prevention Reviewed appropriate screening tests for age  Also reviewed health mt list, fam hx and immunization status , as well as social and family history   See HPI Labs reviewed and ordered Tetanus shot due but pt had good ab when tested so holding off Needs pna 20 vaccine-given today  Sent for last eye exam report  Referred for first screening colonoscopy  Mammogram ordered-pt will call to schedule  Routine gyn visit planned for June , we sent for last test results  Phq score of 0 Great lifestyle habits and wt loss effort

## 2022-05-07 NOTE — Assessment & Plan Note (Signed)
No longer needs medication with recent lifestyle change and wt loss  Also CPAP BP: 128/78   Urged her to keep up the good work  Will continue to monitor

## 2022-05-07 NOTE — Assessment & Plan Note (Signed)
Continues xyzal prn

## 2022-05-07 NOTE — Assessment & Plan Note (Signed)
Hypothyroidism  Pt has no clinical changes No change in energy level/ hair or skin/ edema and no tremor Lab Results  Component Value Date   TSH 1.45 04/30/2022    Taking armour thyroid = will call with dose so we can add to med list

## 2022-05-07 NOTE — Patient Instructions (Addendum)
Pneumonia vaccine today    Call to schedule your colonoscopy   [facility]  [phone]    In the future let us know what dose of armour thyroid you are on  Keep doing what you are doing !/ great !!

## 2022-05-07 NOTE — Assessment & Plan Note (Signed)
Significant wt loss so far  Doing very well with diet and exercise  Enc her to keep up the great work

## 2022-05-07 NOTE — Assessment & Plan Note (Signed)
Lab Results  Component Value Date   HGBA1C 6.2 04/30/2022   This is improved No longer in DM range No medication for this  Wt loss with low glycemic diet and exercise is going well/enc to keep that up

## 2022-05-07 NOTE — Assessment & Plan Note (Signed)
Now using cpap  Has noted feeling better

## 2022-05-07 NOTE — Assessment & Plan Note (Signed)
Disc goals for lipids and reasons to control them Rev last labs with pt Rev low sat fat diet in detail  LDL up to 119 off statin (intol to atorvastatin) Continues to work on diet May need other tx in the future Will continue to obs

## 2022-05-07 NOTE — Progress Notes (Signed)
Subjective:    Patient ID: Sharon Kerr, female    DOB: 1975-11-16, 47 y.o.   MRN: [id number]  HPI Here for health maintenance exam and to review chronic medical problems   Wt Readings from Last 3 Encounters:  05/07/22 298 lb (135.2 kg)  04/14/22 (!) 310 lb (140.6 kg)  07/03/21 (!) 320 lb (145.2 kg)   46.67 kg/m    Vitals:   05/07/22 0854  BP: 128/78  Pulse: 83  Temp: (!) 97.5 F (36.4 C)  SpO2: 99%   Had an achilles problem - had to use a cane for a while  Feeling good   Still losing weigth   Immunization History  Administered Date(s) Administered   Influenza,inj,Quad PF,6+ Mos 11/02/2018, 01/08/2020   Moderna Sars-Covid-2 Vaccination 04/13/2019, 05/11/2019, 01/29/2020   Health Maintenance Due  Topic Date Due   FOOT EXAM  Never done   HIV Screening  Never done   DTaP/Tdap/Td (1 - Tdap) Never done   OPHTHALMOLOGY EXAM  04/20/2020   COLONOSCOPY (Pts 45-39yrs Insurance coverage will need to be confirmed)  Never done   MAMMOGRAM  09/03/2021   PAP SMEAR-Modifier  09/03/2021   Tetanus shot - has good antibodies   Need pna shot- found low ab from blood work with immunologist   Eye exam: last year /goes to [facility]   Colon cancer screening : ready for first colonoscopy  Pref [place]    Mammogram 09/2020, ? If had last summer  Self breast exam : no lumps  Had the Braca test- genetics were negative   Now getting a regular period  No need for contraception right now / not active    Has gyn visit scheduled in June  H/o pcos  Lab Results  Component Value Date   HGBA1C 6.2 04/30/2022     Mood     02/18/2022   10:33 AM 03/13/2021    9:46 AM 04/17/2020   11:45 AM 07/19/2018    1:57 PM  Depression screen PHQ 2/9  Decreased Interest 0 0 1 1  Down, Depressed, Hopeless 0 0 1 1  PHQ - 2 Score 0 0 2 2  Altered sleeping  0 2 3  Tired, decreased energy  0 3 3  Change in appetite  1 1 3   Feeling bad or failure about yourself   0 1 3  Trouble  concentrating  0 1 3  Moving slowly or fidgety/restless  1 0 0  Suicidal thoughts  0 0 0  PHQ-9 Score  2 10 17   Difficult doing work/chores  Not difficult at all Somewhat difficult Somewhat difficult     HTN bp is stable today  No cp or palpitations or headaches or edema  No side effects to medicines  BP Readings from Last 3 Encounters:  05/07/22 128/78  04/14/22 116/72  03/25/22 130/80    Used to take hctz  At home usually 116/70s   Cross fit  Walking  Did 7000 meter row yesterday!   Eliminated all processed foods  Also elim dairy- feels better  Eats fruit and veg and meat  Water    OSA Using cpap -doing well  Getting used to it  Feeling better     Hypothyroidism with thyroid nodule and past surgery  Pt has no clinical changes No change in energy level/ hair or skin/ edema and no tremor Lab Results  Component Value Date   TSH 1.45 04/30/2022    Armor thyroid  Has had levothy in the  past  Does not see endocrinologist    DM2 Lab Results  Component Value Date   HGBA1C 6.2 04/30/2022   Used to be on metformin  No longer on it  Losing weight  Had a prednisone taper in jan   Lab Results  Component Value Date   MICROALBUR <0.7 04/30/2022     Eye exam-utd  Other labs Last metabolic panel Lab Results  Component Value Date   GLUCOSE 107 (H) 04/30/2022   NA 139 04/30/2022   K 4.3 04/30/2022   CL 105 04/30/2022   CO2 25 04/30/2022   BUN 16 04/30/2022   CREATININE 0.69 04/30/2022   GFRNONAA >60 02/06/2021   CALCIUM 9.6 04/30/2022   PROT 6.7 04/30/2022   ALBUMIN 4.0 04/30/2022   BILITOT 1.3 (H) 04/30/2022   ALKPHOS 53 04/30/2022   AST 16 04/30/2022   ALT 26 04/30/2022   ANIONGAP 10 02/06/2021   Gfr of 103  Lab Results  Component Value Date   WBC 6.8 04/30/2022   HGB 14.9 04/30/2022   HCT 43.9 04/30/2022   MCV 88.1 04/30/2022   PLT 230.0 04/30/2022    Cholesterol Lab Results  Component Value Date   CHOL 168 04/30/2022   CHOL 108  07/08/2021   CHOL 123 01/10/2019   Lab Results  Component Value Date   HDL 31.40 (L) 04/30/2022   HDL 31.80 (L) 07/08/2021   HDL 32 (A) 01/10/2019   Lab Results  Component Value Date   LDLCALC 119 (H) 04/30/2022   LDLCALC 55 07/08/2021   LDLCALC 73 01/10/2019   Lab Results  Component Value Date   TRIG 91.0 04/30/2022   TRIG 106.0 07/08/2021   TRIG 90 01/10/2019   Lab Results  Component Value Date   CHOLHDL 5 04/30/2022   CHOLHDL 3 07/08/2021   CHOLHDL 5 11/21/2018   No results found for: "LDLDIRECT"  Used to take atorvastin  She felt bad on it - pain   Patient Active Problem List   Diagnosis Date Noted   Routine general medical examination at a health care facility 05/07/2022   Hyperlipidemia, mild 05/07/2022   OSA (obstructive sleep apnea) 05/07/2022   Colon cancer screening 05/07/2022   Psoriasis 01/10/2022   Shellfish allergy 12/31/2021   Snoring 11/11/2021   Wheezing 03/04/2021   Prediabetes 01/11/2020   Allergic rhinitis due to animal hair and dander 01/11/2020   Essential hypertension, benign 02/07/2016   PCOS (polycystic ovarian syndrome) 06/26/2014   Hypothyroidism associated with surgical procedure    Social anxiety disorder 12/16/2012   Allergic rhinitis due to pollen    Thyroid nodule 07/08/2011   Morbid obesity 07/08/2011   Aortic regurgitation 07/06/2011   ACUTE GOUTY ARTHROPATHY 10/25/2009   Asthma 11/23/2007   Past Medical History:  Diagnosis Date   Allergic rhinitis due to pollen    Aortic regurgitation 07/06/2011   Asthma    History of high blood pressure    without diag   Hypothyroidism associated with surgical procedure    Migraine    Panic attacks 12/16/2012   PCOS (polycystic ovarian syndrome) 06/26/2014   Social anxiety disorder 12/16/2012   Past Surgical History:  Procedure Laterality Date   [place]  2001   LEEP     THYROIDECTOMY, PARTIAL  2009   [facility]   Social  History   Tobacco Use   Smoking status: Never   Smokeless tobacco: Never  Vaping Use  Vaping Use: Never used  Substance Use Topics   Alcohol use: No    Alcohol/week: 0.0 standard drinks of alcohol   Drug use: No   Family History  Problem Relation Age of Onset   Breast cancer Mother 78   Allergies  Allergen Reactions   Oxycodone Itching and Rash    Other Reaction: Red all over   Atorvastatin     Muscle pain   Codeine    Current Outpatient Medications on File Prior to Visit  Medication Sig Dispense Refill   EPINEPHrine (EPIPEN IJ) Inject as directed daily as needed.     levocetirizine (XYZAL) 5 MG tablet Take 1 tablet (5 mg total) by mouth every evening. 30 tablet 5   No current facility-administered medications on file prior to visit.    Review of Systems  Constitutional:  Negative for activity change, appetite change, fatigue, fever and unexpected weight change.  HENT:  Negative for congestion, ear pain, rhinorrhea, sinus pressure and sore throat.   Eyes:  Negative for pain, redness and visual disturbance.  Respiratory:  Negative for cough, shortness of breath and wheezing.        Getting used to cpap  Cardiovascular:  Negative for chest pain and palpitations.  Gastrointestinal:  Negative for abdominal pain, blood in stool, constipation and diarrhea.  Endocrine: Negative for polydipsia and polyuria.  Genitourinary:  Negative for dysuria, frequency and urgency.  Musculoskeletal:  Negative for arthralgias, back pain and myalgias.       Had an achilles problem   Skin:  Negative for pallor and rash.  Allergic/Immunologic: Negative for environmental allergies.  Neurological:  Negative for dizziness, syncope and headaches.  Hematological:  Negative for adenopathy. Does not bruise/bleed easily.  Psychiatric/Behavioral:  Negative for decreased concentration and dysphoric mood. The patient is not nervous/anxious.        Objective:   Physical Exam Constitutional:       General: She is not in acute distress.    Appearance: Normal appearance. She is well-developed. She is obese. She is not ill-appearing or diaphoretic.  HENT:     Head: Normocephalic and atraumatic.     Right Ear: Tympanic membrane, ear canal and external ear normal.     Left Ear: Tympanic membrane, ear canal and external ear normal.     Nose: Nose normal. No congestion.     Mouth/Throat:     Mouth: Mucous membranes are moist.     Pharynx: Oropharynx is clear. No posterior oropharyngeal erythema.  Eyes:     General: No scleral icterus.    Extraocular Movements: Extraocular movements intact.     Conjunctiva/sclera: Conjunctivae normal.     Pupils: Pupils are equal, round, and reactive to light.  Neck:     Thyroid: No thyromegaly.     Vascular: No carotid bruit or JVD.  Cardiovascular:     Rate and Rhythm: Normal rate and regular rhythm.     Pulses: Normal pulses.     Heart sounds: Normal heart sounds.     No gallop.  Pulmonary:     Effort: Pulmonary effort is normal. No respiratory distress.     Breath sounds: Normal breath sounds. No wheezing.     Comments: Good air exch Chest:     Chest wall: No tenderness.  Abdominal:     General: Bowel sounds are normal. There is no distension or abdominal bruit.     Palpations: Abdomen is soft. There is no mass.     Tenderness: There is no  abdominal tenderness.     Hernia: No hernia is present.  Genitourinary:    Comments: Breast exam: No mass, nodules, thickening, tenderness, bulging, retraction, inflamation, nipple discharge or skin changes noted.  No axillary or clavicular LA.     Musculoskeletal:        General: No tenderness. Normal range of motion.     Cervical back: Normal range of motion and neck supple. No rigidity. No muscular tenderness.     Right lower leg: No edema.     Left lower leg: No edema.     Comments: No kyphosis   Lymphadenopathy:     Cervical: No cervical adenopathy.  Skin:    General: Skin is warm and dry.      Coloration: Skin is not pale.     Findings: No erythema or rash.     Comments: Solar lentigines diffusely   Neurological:     Mental Status: She is alert. Mental status is at baseline.     Cranial Nerves: No cranial nerve deficit.     Motor: No abnormal muscle tone.     Coordination: Coordination normal.     Gait: Gait normal.     Deep Tendon Reflexes: Reflexes are normal and symmetric. Reflexes normal.  Psychiatric:        Mood and Affect: Mood normal.        Cognition and Memory: Cognition and memory normal.           Assessment & Plan:   Problem List Items Addressed This Visit       Cardiovascular and Mediastinum   Essential hypertension, benign    No longer needs medication with recent lifestyle change and wt loss  Also CPAP BP: 128/78   Urged her to keep up the good work  Will continue to monitor         Respiratory   Allergic rhinitis due to pollen    Continues xyzal prn      OSA (obstructive sleep apnea)    Now using cpap  Has noted feeling better         Endocrine   Hypothyroidism associated with surgical procedure (Chronic)    Hypothyroidism  Pt has no clinical changes No change in energy level/ hair or skin/ edema and no tremor Lab Results  Component Value Date   TSH 1.45 04/30/2022    Taking armour thyroid = will call with dose so we can add to med list       PCOS (polycystic ovarian syndrome)    With recent wt loss periods have retuned regularly  No longer on metformin  A1c is down         Other   Colon cancer screening    Referred for first screening colonoscopy      Relevant Orders   Ambulatory referral to Gastroenterology   Hyperlipidemia, mild    Disc goals for lipids and reasons to control them Rev last labs with pt Rev low sat fat diet in detail  LDL up to 119 off statin (intol to atorvastatin) Continues to work on diet May need other tx in the future Will continue to obs      Morbid obesity    Significant wt loss  so far  Doing very well with diet and exercise  Enc her to keep up the great work      Prediabetes    Lab Results  Component Value Date   HGBA1C 6.2 04/30/2022  This is improved No longer in  DM range No medication for this  Wt loss with low glycemic diet and exercise is going well/enc to keep that up       Routine general medical examination at a health care facility - Primary    Reviewed health habits including diet and exercise and skin cancer prevention Reviewed appropriate screening tests for age  Also reviewed health mt list, fam hx and immunization status , as well as social and family history   See HPI Labs reviewed and ordered Tetanus shot due but pt had good ab when tested so holding off Needs pna 20 vaccine-given today  Sent for last eye exam report  Referred for first screening colonoscopy  Mammogram ordered-pt will call to schedule  Routine gyn visit planned for June , we sent for last test results  Phq score of 0 Great lifestyle habits and wt loss effort       Other Visit Diagnoses     Need for pneumococcal 20-valent conjugate vaccination       Relevant Orders   Pneumococcal conjugate vaccine 20-valent (Prevnar 20) (Completed)

## 2022-05-07 NOTE — Assessment & Plan Note (Signed)
With recent wt loss periods have retuned regularly  No longer on metformin  A1c is down

## 2022-05-07 NOTE — Assessment & Plan Note (Signed)
Referred for first screening colonoscopy  

## 2022-05-14 ENCOUNTER — Telehealth: Payer: Self-pay | Admitting: *Deleted

## 2022-05-14 ENCOUNTER — Other Ambulatory Visit: Payer: Self-pay | Admitting: *Deleted

## 2022-05-14 ENCOUNTER — Telehealth: Payer: Self-pay

## 2022-05-14 DIAGNOSIS — Z1211 Encounter for screening for malignant neoplasm of colon: Secondary | ICD-10-CM

## 2022-05-14 MED ORDER — NA SULFATE-K SULFATE-MG SULF 17.5-3.13-1.6 GM/177ML PO SOLN: 1.0000 | Freq: Once | ORAL | 0 refills | Status: AC

## 2022-05-14 NOTE — Telephone Encounter (Signed)
Colonoscopy schedule on 06/15/2022

## 2022-05-14 NOTE — Telephone Encounter (Signed)
Pt left message to schedule colonoscopy please return call  

## 2022-05-14 NOTE — Telephone Encounter (Signed)
Gastroenterology Pre-Procedure Review  Request Date: 06/15/2022 Requesting Physician: Dr. Allegra Lai  PATIENT REVIEW QUESTIONS: The patient responded to the following health history questions as indicated:    1. Are you having any GI issues? no 2. Do you have a personal history of Polyps? no 3. Do you have a family history of Colon Cancer or Polyps? no 4. Diabetes Mellitus? no 5. Joint replacements in the past 12 months?no 6. Major health problems in the past 3 months?no 7. Any artificial heart valves, MVP, or defibrillator?no    MEDICATIONS & ALLERGIES:    Patient reports the following regarding taking any anticoagulation/antiplatelet therapy:   Plavix, Coumadin, Eliquis, Xarelto, Lovenox, Pradaxa, Brilinta, or Effient? no Aspirin? no  Patient confirms/reports the following medications:  Current Outpatient Medications  Medication Sig Dispense Refill   Na Sulfate-K Sulfate-Mg Sulf 17.5-3.13-1.6 GM/177ML SOLN Take 1 kit by mouth once for 1 dose. 354 mL 0   EPINEPHrine (EPIPEN IJ) Inject as directed daily as needed.     levocetirizine (XYZAL) 5 MG tablet Take 1 tablet (5 mg total) by mouth every evening. 30 tablet 5   No current facility-administered medications for this visit.    Patient confirms/reports the following allergies:  Allergies  Allergen Reactions   Oxycodone Itching and Rash    Other Reaction: Red all over   Atorvastatin     Muscle pain   Codeine     No orders of the defined types were placed in this encounter.   AUTHORIZATION INFORMATION Primary Insurance: 1D#: Group #:  Secondary Insurance: 1D#: Group #:  SCHEDULE INFORMATION: Date: 06/15/2022 Time: Location:  [facility]

## 2022-06-02 ENCOUNTER — Telehealth: Payer: Self-pay

## 2022-06-02 DIAGNOSIS — Z1211 Encounter for screening for malignant neoplasm of colon: Secondary | ICD-10-CM

## 2022-06-02 NOTE — Telephone Encounter (Signed)
Patient left voice message to reschedule her colonoscopy with Dr. Allegra Lai from 06/15/22.  Call returned she has been rescheduled to 07/20/22. Trish in Endo notified of rescheduled date.  Thanks,  [place], [place]

## 2022-07-15 ENCOUNTER — Telehealth: Payer: Self-pay

## 2022-07-15 NOTE — Telephone Encounter (Signed)
Informed trish of the moved

## 2022-07-15 NOTE — Telephone Encounter (Signed)
Patient left message on my phone stating they had a death in there family and need to reschedule her procedure for 07/20/2022. Called patient and reschedule patient to 09/04/2022.  Called Endo 2 times and they did not answer. Called again and talk to Raynelle Fanning and she asked to call back in 10 minutes because Rosann Auerbach is at lunch.

## 2022-08-31 ENCOUNTER — Telehealth: Payer: Self-pay | Admitting: *Deleted

## 2022-08-31 NOTE — Telephone Encounter (Signed)
Patient was transferred to my phone because she needed to reschedule her colonoscopy. Patient have family/friends from out town coming who won't be able to change.  Requesting to reschedule to 11/09/2022 with Dr Allegra Lai.  Called and made the change with endo unit  New instructions will be sent.

## 2022-09-23 ENCOUNTER — Ambulatory Visit: Payer: BC Managed Care – PPO | Admitting: Allergy

## 2022-09-23 DIAGNOSIS — J309 Allergic rhinitis, unspecified: Secondary | ICD-10-CM

## 2022-09-24 ENCOUNTER — Ambulatory Visit: Payer: BC Managed Care – PPO | Admitting: Family Medicine

## 2022-09-24 VITALS — BP 130/90 | HR 69 | Temp 98.5°F | Ht 67.0 in

## 2022-09-24 DIAGNOSIS — M545 Low back pain, unspecified: Secondary | ICD-10-CM | POA: Diagnosis not present

## 2022-09-24 DIAGNOSIS — G8929 Other chronic pain: Secondary | ICD-10-CM

## 2022-09-24 DIAGNOSIS — M7662 Achilles tendinitis, left leg: Secondary | ICD-10-CM | POA: Diagnosis not present

## 2022-09-24 DIAGNOSIS — M7661 Achilles tendinitis, right leg: Secondary | ICD-10-CM

## 2022-09-24 MED ORDER — NITROGLYCERIN 0.2 MG/HR TD PT24: MEDICATED_PATCH | TRANSDERMAL | 2 refills | Status: DC

## 2022-09-24 NOTE — Patient Instructions (Signed)
Achilles Tendon Rehab  Start easy.  It is ok if there is mild discomfort, but if there is pain, then back off how much rehab you are doing.  For achilles rehab, you basically just need to concentrate on the ankle going up and down.  Start: Calf raises while seated First lower and then raise on both feet Assist lifting with hands and then slowly lower  Begin with 3 sets of 10 repetitions Increase by 5 repetitions every 3 days  Goal is 3 sets of 30 repetitions  If feels good at 3 sets of 30 - add backpack with 5 lbs  Increase by 5 lbs per week to max of 30 lbs   Alternative: If you have weights at home, you can put a dumbbell upright on the knee of the effected heel.  Go up with assistance from your hands, and then lower slowly.  If this is easy, then change to calf lowers standing on a step.  A heel cup of any brand will elevate the heel and take stress off of the achilles tendon. I particularly like the brand called Tuli's heel cups.  They cup the back of the heel, too.  They can be hard to find unless you can order off of the computer like on [employer]. Any heel cup is ok, though, including ones you can find at any pharmacy  

## 2022-09-24 NOTE — Progress Notes (Signed)
Sharon Berry T. [name], MD, CAQ Sports Medicine [facility] [address] [place], [address]  Phone: [phone]  FAX: [phone]  Sharon Kerr - 47 y.o. female  MRN [id number]  Date of Birth: 06-08-1975  Date: 09/24/2022  PCP: [name], MD  Referral: [name], MD  Chief Complaint  Patient presents with   Tendonitis    Bilateral but left is worse   Hip Pain    Right   Subjective:   Sharon Kerr is a 47 y.o. very pleasant female patient with Body mass index is 46.67 kg/m. who presents with the following:  She presents with some ongoing B heel pain.  She is also having some hip pain, requestioning, this is really more posterior buttocks pain on the right side.  October 2023 hip x-ray was unremarkable with no significant osteoarthritis.  B achilles tendonitis -she does have a multi month history of ongoing pain at the Achilles insertion with some swelling and a nodule at the insertion point, more on the right Walking, cleaning, jumping jacks hurt a lot.  She has pain with pushoff She has tried to do some box jumps at [employer], and these also hurt really a lot  Doing heel raises and stretches She has been adapting her workouts to try to compensate for her heel pain Over-the-counter NSAIDs have not provided any significant relief, some that is short-lived  Posterior buttocks on the r side also hurts.  She is having some additional pain in the lowest part of her lumbar spine She is having some pain in the buttocks on the right, she is not having any radicular pains down the leg any farther than her buttocks She is not having any strength changes in the lower extremity No sensation changes No saddle anesthesia or incontinence  Review of Systems is noted in the HPI, as appropriate  Objective:   BP (!) 130/90 (BP Location: Left Arm, Patient Position: Sitting, Cuff Size: Large)   Pulse 69   Temp 98.5 F (36.9 C)  (Temporal)   Ht 5\' 7"  (1.702 m)   LMP 08/30/2022   SpO2 99%   BMI 46.67 kg/m   GEN: No acute distress; alert,appropriate. PULM: Breathing comfortably in no respiratory distress PSYCH: Normally interactive.    Foot: B Echymosis: no Edema: modest ROM: full LE B Gait: heel toe, non-antalgic MT pain: no Callus pattern: none Lateral Mall: NT Medial Mall: NT Talus: NT Navicular: NT Cuboid: NT Calcaneous: NT Metatarsals: NT 5th MT: NT Phalanges: NT Achilles: PAINFUL TO PALPATE AT INSERTION ON BOTH SIDES, SMALL NODULES Plantar Fascia: NT Fat Pad: NT Peroneals: NT Post Tib: NT Great Toe: Nml motion Ant Drawer: neg ATFL: NT CFL: NT Deltoid: NT Sensation: intact    Range of motion at  the waist: Flexion, extension, lateral bending and rotation: She has no limitation in forward flexion, extension, lateral bending, and rotation maneuvers  No echymosis or edema Rises to examination table with mild difficulty Gait: minimally antalgic  Inspection/Deformity: N Paraspinus Tenderness: L4-S1 bilaterally  B Ankle Dorsiflexion (L5,4): 5/5 B Great Toe Dorsiflexion (L5,4): 5/5 Heel Walk (L5): WNL Toe Walk (S1): WNL Rise/Squat (L4): WNL, mild pain  SENSORY B Medial Foot (L4): WNL B Dorsum (L5): WNL B Lateral (S1): WNL Light Touch: WNL Pinprick: WNL  B SLR, seated: neg B SLR, supine: neg B FABER: Back pain B Reverse FABER: Pain B Greater Troch: NT B Log Roll: neg B Sciatic Notch: Tender to palpation  There is some tenderness to palpation directly at the piriformis on the right  Laboratory and Imaging Data:  Assessment and Plan:     ICD-10-CM   1. Achilles tendonitis, bilateral  M76.61    M76.62     2. Chronic right-sided low back pain without sciatica  M54.50    G89.29      Pathophysiology of achilles tendinopathy reviewed.  Additionally, I have given the patient the program emphasizing eccentric overloading detailed in the instructions based on Dr.  Renato Gails work and protocols.  Supportive footwear reviewed.  She is going to good a heel lift Will also initiate bilateral nitroglycerin protocol  She also has intermittent ongoing posterior buttocks pain referred from the back.  Thing that she can continue with hip range of motion, core strength that she is doing.  Strength is actually fairly good.  Hip motion.  Suspect long-term weight loss will also help.  Fitness and activity level will ultimately likely decrease her intermittent ongoing symptoms.  Patient Instructions  Achilles Tendon Rehab  Start easy.  It is ok if there is mild discomfort, but if there is pain, then back off how much rehab you are doing.  For achilles rehab, you basically just need to concentrate on the ankle going up and down.  Start: Calf raises while seated First lower and then raise on both feet Assist lifting with hands and then slowly lower  Begin with 3 sets of 10 repetitions Increase by 5 repetitions every 3 days  Goal is 3 sets of 30 repetitions  If feels good at 3 sets of 30 - add backpack with 5 lbs  Increase by 5 lbs per week to max of 30 lbs   Alternative: If you have weights at home, you can put a dumbbell upright on the knee of the effected heel.  Go up with assistance from your hands, and then lower slowly.  If this is easy, then change to calf lowers standing on a step.  A heel cup of any brand will elevate the heel and take stress off of the achilles tendon. I particularly like the brand called Tuli's heel cups.  They cup the back of the heel, too.  They can be hard to find unless you can order off of the computer like on [employer]. Any heel cup is ok, though, including ones you can find at any pharmacy    Medication Management during today's office visit: Meds ordered this encounter  Medications   nitroGLYCERIN (NITRODUR - DOSED IN MG/24 HR) 0.2 mg/hr patch    Sig: Apply 1/4 patch to affected area as directed by MD and change every  24 hours.    Dispense:  30 patch    Refill:  2   Medications Discontinued During This Encounter  Medication Reason   levocetirizine (XYZAL) 5 MG tablet Patient Preference    Orders placed today for conditions managed today: No orders of the defined types were placed in this encounter.   Disposition: 2 mo  [employer] One speech-to-text software was used for transcription in this dictation.  Possible transcriptional errors can occur using Animal nutritionist.   Signed,  Elpidio Galea. Melah Ebling, MD   Outpatient Encounter Medications as of 09/24/2022  Medication Sig   ARMOUR THYROID 60 MG tablet Take 60 mg by mouth daily.   nitroGLYCERIN (NITRODUR - DOSED IN MG/24 HR) 0.2 mg/hr patch Apply 1/4 patch to affected area as directed by MD and change every 24 hours.   EPINEPHrine (EPIPEN IJ)  Inject as directed daily as needed.   [DISCONTINUED] levocetirizine (XYZAL) 5 MG tablet Take 1 tablet (5 mg total) by mouth every evening.   No facility-administered encounter medications on file as of 09/24/2022.

## 2022-11-06 ENCOUNTER — Encounter: Payer: Self-pay | Admitting: Gastroenterology

## 2022-11-09 ENCOUNTER — Encounter
Admission: RE | Disposition: A | Discharge status: Home / Self Care | Payer: Self-pay | Source: Home / Self Care | Attending: Gastroenterology

## 2022-11-09 ENCOUNTER — Ambulatory Visit
Admission: RE | Admit: 2022-11-09 | Discharge: 2022-11-09 | Disposition: A | Discharge status: Home / Self Care | Payer: BC Managed Care – PPO | Attending: Gastroenterology | Admitting: Gastroenterology

## 2022-11-09 ENCOUNTER — Ambulatory Visit: Payer: BC Managed Care – PPO

## 2022-11-09 ENCOUNTER — Other Ambulatory Visit: Payer: Self-pay

## 2022-11-09 ENCOUNTER — Encounter: Payer: Self-pay | Admitting: Gastroenterology

## 2022-11-09 DIAGNOSIS — Z1211 Encounter for screening for malignant neoplasm of colon: Secondary | ICD-10-CM | POA: Diagnosis not present

## 2022-11-09 DIAGNOSIS — E890 Postprocedural hypothyroidism: Secondary | ICD-10-CM | POA: Diagnosis not present

## 2022-11-09 DIAGNOSIS — D125 Benign neoplasm of sigmoid colon: Secondary | ICD-10-CM | POA: Insufficient documentation

## 2022-11-09 DIAGNOSIS — K635 Polyp of colon: Secondary | ICD-10-CM

## 2022-11-09 DIAGNOSIS — I10 Essential (primary) hypertension: Secondary | ICD-10-CM | POA: Diagnosis not present

## 2022-11-09 DIAGNOSIS — D124 Benign neoplasm of descending colon: Secondary | ICD-10-CM | POA: Diagnosis not present

## 2022-11-09 DIAGNOSIS — K5730 Diverticulosis of large intestine without perforation or abscess without bleeding: Secondary | ICD-10-CM | POA: Diagnosis not present

## 2022-11-09 DIAGNOSIS — D120 Benign neoplasm of cecum: Secondary | ICD-10-CM | POA: Diagnosis not present

## 2022-11-09 HISTORY — PX: POLYPECTOMY: SHX5525

## 2022-11-09 HISTORY — PX: COLONOSCOPY WITH PROPOFOL: SHX5780

## 2022-11-09 HISTORY — DX: Obesity, unspecified: E66.9

## 2022-11-09 LAB — POCT PREGNANCY, URINE: Preg Test, Ur: NEGATIVE

## 2022-11-09 SURGERY — COLONOSCOPY WITH PROPOFOL
Anesthesia: General

## 2022-11-09 MED ORDER — DEXMEDETOMIDINE HCL IN NACL 80 MCG/20ML IV SOLN
INTRAVENOUS | Status: DC | PRN
Start: 2022-11-09 — End: 2022-11-09
  Administered 2022-11-09: 4 ug via INTRAVENOUS
  Administered 2022-11-09: 8 ug via INTRAVENOUS

## 2022-11-09 MED ORDER — LIDOCAINE HCL (PF) 2 % IJ SOLN
INTRAMUSCULAR | Status: AC
  Filled 2022-11-09: qty 5

## 2022-11-09 MED ORDER — PROPOFOL 10 MG/ML IV BOLUS
INTRAVENOUS | Status: AC
  Filled 2022-11-09: qty 40

## 2022-11-09 MED ORDER — DEXMEDETOMIDINE HCL IN NACL 80 MCG/20ML IV SOLN
INTRAVENOUS | Status: AC
  Filled 2022-11-09: qty 20

## 2022-11-09 MED ORDER — PROPOFOL 500 MG/50ML IV EMUL
INTRAVENOUS | Status: DC | PRN
  Administered 2022-11-09: 75 ug/kg/min via INTRAVENOUS

## 2022-11-09 MED ORDER — PROPOFOL 10 MG/ML IV BOLUS
INTRAVENOUS | Status: DC | PRN
  Administered 2022-11-09 (×3): 50 mg via INTRAVENOUS

## 2022-11-09 MED ORDER — SODIUM CHLORIDE 0.9 % IV SOLN: INTRAVENOUS | Status: DC

## 2022-11-09 MED ORDER — LIDOCAINE HCL (CARDIAC) PF 100 MG/5ML IV SOSY
PREFILLED_SYRINGE | INTRAVENOUS | Status: DC | PRN
  Administered 2022-11-09: 50 mg via INTRAVENOUS

## 2022-11-09 NOTE — Transfer of Care (Signed)
Immediate Anesthesia Transfer of Care Note  Patient: Sharon Kerr  Procedure(s) Performed: COLONOSCOPY WITH PROPOFOL  Patient Location: PACU  Anesthesia Type:MAC  Level of Consciousness: drowsy  Airway & Oxygen Therapy: Patient Spontanous Breathing  Post-op Assessment: Report given to RN and Post -op Vital signs reviewed and stable  Post vital signs: Reviewed and stable  Last Vitals:  Vitals Value Taken Time  BP 102/62 11/09/22 0938  Temp 35.9 C 11/09/22 0938  Pulse 83 11/09/22 0938  Resp 9 11/09/22 0938  SpO2 94 % 11/09/22 0938  Vitals shown include unfiled device data.  Last Pain:  Vitals:   11/09/22 0938  TempSrc: Temporal  PainSc:          Complications: No notable events documented.

## 2022-11-09 NOTE — H&P (Signed)
Arlyss Repress, MD [address]  [address]  [place], [place] [address]  Main: [phone]  Fax: [phone] Pager: [phone]  Primary Care Physician:  Tower, Audrie Gallus, MD Primary Gastroenterologist:  Dr. Arlyss Repress  Pre-Procedure History & Physical: HPI:  Sharon Kerr is a 47 y.o. female is here for an colonoscopy.   Past Medical History:  Diagnosis Date   Allergic rhinitis due to pollen    Aortic regurgitation 07/06/2011   Asthma    History of high blood pressure    without diag   Hypothyroidism associated with surgical procedure    Migraine    Obese    Panic attacks 12/16/2012   PCOS (polycystic ovarian syndrome) 06/26/2014   Social anxiety disorder 12/16/2012    Past Surgical History:  Procedure Laterality Date   BREAST ENHANCEMENT SURGERY  1999   CESAREAN SECTION     FOOT SURGERY  2001   LEEP     THYROIDECTOMY, PARTIAL  2009   [facility]    Prior to Admission medications   Medication Sig Start Date End Date Taking? Authorizing Provider  ARMOUR THYROID 60 MG tablet Take 60 mg by mouth daily. 07/21/22  Yes [provider]  EPINEPHrine (EPIPEN IJ) Inject as directed daily as needed.    [provider]  nitroGLYCERIN (NITRODUR - DOSED IN MG/24 HR) 0.2 mg/hr patch Apply 1/4 patch to affected area as directed by MD and change every 24 hours. 09/24/22   Hannah Beat, MD    Allergies as of 05/15/2022 - Review Complete 05/07/2022  Allergen Reaction Noted   Oxycodone Itching and Rash 06/25/2014   Atorvastatin  05/07/2022   Codeine  11/23/2007    Family History  Problem Relation Age of Onset   Breast cancer Mother 31    Social History   Socioeconomic History   Marital status: Married    Spouse name: Jimmy   Number of children: 1   Years of education: some college   Highest education level: Associate degree: academic program  Occupational History   Occupation: Biomedical scientist  Tobacco Use   Smoking status: Never   Smokeless  tobacco: Never  Vaping Use   Vaping status: Never Used  Substance and Sexual Activity   Alcohol use: No    Alcohol/week: 0.0 standard drinks of alcohol   Drug use: No   Sexual activity: Yes    Birth control/protection: None, Abstinence  Other Topics Concern   Not on file  Social History Narrative   04/17/20   From: [place] moved for husband   Living: with Chanetta Marshall, husband (2001) and Samuel Bouche (2009)   Work: owns a Radiation protection practitioner firm      Family: son Samuel Bouche (2009)      Enjoys: works 7 days a week      Exercise: not currently   Diet: hit or miss, limited daytime eating      Safety   Seat belts: Yes    Guns: Yes  and secure   Safe in relationships: Yes    Social Determinants of Corporate investment banker Strain: Low Risk  (09/24/2022)   Overall Financial Resource Strain (CARDIA)    Difficulty of Paying Living Expenses: Not hard at all  Food Insecurity: No Food Insecurity (09/24/2022)   Hunger Vital Sign    Worried About Running Out of Food in the Last Year: Never true    Ran Out of Food in the Last Year: Never true  Transportation Needs: No Transportation Needs (09/24/2022)  PRAPARE - Administrator, Civil Service (Medical): No    Lack of Transportation (Non-Medical): No  Physical Activity: Sufficiently Active (09/24/2022)   Exercise Vital Sign    Days of Exercise per Week: 4 days    Minutes of Exercise per Session: 60 min  Stress: Stress Concern Present (09/24/2022)   [employer] of Occupational Health - Occupational Stress Questionnaire    Feeling of Stress : To some extent  Social Connections: Socially Integrated (09/24/2022)   Social Connection and Isolation Panel [NHANES]    Frequency of Communication with Friends and Family: Twice a week    Frequency of Social Gatherings with Friends and Family: Once a week    Attends Religious Services: More than 4 times per year    Active Member of [employer] or Organizations: Yes    Attends Museum/gallery exhibitions officer: More than 4 times per year    Marital Status: Married  Catering manager Violence: Not on file    Review of Systems: See HPI, otherwise negative ROS  Physical Exam: BP 130/89   Pulse 74   Temp (!) 97.5 F (36.4 C) (Temporal)   Resp 20   Ht 5\' 8"  (1.727 m)   Wt 126.2 kg   SpO2 97%   BMI 42.30 kg/m  General:   Alert,  pleasant and cooperative in NAD Head:  Normocephalic and atraumatic. Neck:  Supple; no masses or thyromegaly. Lungs:  Clear throughout to auscultation.    Heart:  Regular rate and rhythm. Abdomen:  Soft, nontender and nondistended. Normal bowel sounds, without guarding, and without rebound.   Neurologic:  Alert and  oriented x4;  grossly normal neurologically.  Impression/Plan: Sharon Kerr is here for an colonoscopy to be performed for colon cancer screening  Risks, benefits, limitations, and alternatives regarding  colonoscopy have been reviewed with the patient.  Questions have been answered.  All parties agreeable.   Lannette Donath, MD  11/09/2022, 9:07 AM

## 2022-11-09 NOTE — Anesthesia Preprocedure Evaluation (Signed)
Anesthesia Evaluation  Patient identified by MRN, date of birth, ID band Patient awake    Reviewed: Allergy & Precautions, H&P , NPO status , Patient's Chart, lab work & pertinent test results, reviewed documented beta blocker date and time   Airway Mallampati: II   Neck ROM: full    Dental  (+) Poor Dentition   Pulmonary neg shortness of breath, asthma , sleep apnea and Continuous Positive Airway Pressure Ventilation    Pulmonary exam normal        Cardiovascular Exercise Tolerance: Poor hypertension, Normal cardiovascular exam Rhythm:regular Rate:Normal     Neuro/Psych   Anxiety     negative neurological ROS  negative psych ROS   GI/Hepatic negative GI ROS, Neg liver ROS,,,  Endo/Other  Hypothyroidism    Renal/GU negative Renal ROS  negative genitourinary   Musculoskeletal   Abdominal   Peds  Hematology negative hematology ROS (+)   Anesthesia Other Findings Past Medical History: No date: Allergic rhinitis due to pollen 07/06/2011: Aortic regurgitation No date: Asthma No date: History of high blood pressure     Comment:  without diag No date: Hypothyroidism associated with surgical procedure No date: Migraine No date: Obese 12/16/2012: Panic attacks 06/26/2014: PCOS (polycystic ovarian syndrome) 12/16/2012: Social anxiety disorder Past Surgical History: 1999: BREAST ENHANCEMENT SURGERY No date: CESAREAN SECTION 2001: FOOT SURGERY No date: LEEP 2009: THYROIDECTOMY, PARTIAL     Comment:  [facility] BMI    Body Mass Index: 42.30 kg/m     Reproductive/Obstetrics negative OB ROS                             Anesthesia Physical Anesthesia Plan  ASA: 3  Anesthesia Plan: General   Post-op Pain Management:    Induction:   PONV Risk Score and Plan:   Airway Management Planned:   Additional Equipment:   Intra-op Plan:   Post-operative Plan:   Informed Consent: I have  reviewed the patients History and Physical, chart, labs and discussed the procedure including the risks, benefits and alternatives for the proposed anesthesia with the patient or authorized representative who has indicated his/her understanding and acceptance.     Dental Advisory Given  Plan Discussed with: CRNA  Anesthesia Plan Comments:        Anesthesia Quick Evaluation

## 2022-11-09 NOTE — Anesthesia Procedure Notes (Signed)
Procedure Name: MAC Date/Time: 11/09/2022 9:15 AM  Performed by: Elisabeth Pigeon, CRNAPre-anesthesia Checklist: Patient identified, Emergency Drugs available, Suction available, Patient being monitored and Timeout performed Oxygen Delivery Method: Nasal cannula

## 2022-11-09 NOTE — Anesthesia Postprocedure Evaluation (Signed)
Anesthesia Post Note  Patient: Myrakle Wingler  Procedure(s) Performed: COLONOSCOPY WITH PROPOFOL  Patient location during evaluation: PACU Anesthesia Type: General Level of consciousness: awake and alert Pain management: pain level controlled Vital Signs Assessment: post-procedure vital signs reviewed and stable Respiratory status: spontaneous breathing, nonlabored ventilation, respiratory function stable and patient connected to nasal cannula oxygen Cardiovascular status: blood pressure returned to baseline and stable Postop Assessment: no apparent nausea or vomiting Anesthetic complications: no   No notable events documented.   Last Vitals:  Vitals:   11/09/22 0938 11/09/22 0948  BP: 102/62 120/81  Pulse: 78 71  Resp: (!) 23 (!) 25  Temp: (!) 35.9 C   SpO2: 95% 100%    Last Pain:  Vitals:   11/09/22 0948  TempSrc:   PainSc: 0-No pain                 Yevette Edwards

## 2022-11-09 NOTE — Op Note (Signed)
[facility] Patient Name: Sharon Kerr Procedure Date: 11/09/2022 9:13 AM MRN: [id number] Account #: [id number] Date of Birth: 1975/10/01 Admit Type: Outpatient Age: 47 Room: [facility] ENDO ROOM 4 Gender: Female Note Status: Finalized Instrument Name: Prentice Docker [id number] Procedure:             Colonoscopy Indications:           Screening for colorectal malignant neoplasm, This is                         the patient's first colonoscopy Providers:             Toney Reil MD, MD Referring MD:          Audrie Gallus. Tower (Referring MD) Medicines:             General Anesthesia Complications:         No immediate complications. Estimated blood loss: None. Procedure:             Pre-Anesthesia Assessment:                        - Prior to the procedure, a History and Physical was                         performed, and patient medications and allergies were                         reviewed. The patient is competent. The risks and                         benefits of the procedure and the sedation options and                         risks were discussed with the patient. All questions                         were answered and informed consent was obtained.                         Patient identification and proposed procedure were                         verified by the physician, the nurse, the                         anesthesiologist, the anesthetist and the technician                         in the pre-procedure area in the procedure room in the                         endoscopy suite. Mental Status Examination: alert and                         oriented. Airway Examination: normal oropharyngeal                         airway and neck mobility. Respiratory Examination:  clear to auscultation. CV Examination: normal.                         Prophylactic Antibiotics: The patient does not require                         prophylactic  antibiotics. Prior Anticoagulants: The                         patient has taken no anticoagulant or antiplatelet                         agents. ASA Grade Assessment: III - A patient with                         severe systemic disease. After reviewing the risks and                         benefits, the patient was deemed in satisfactory                         condition to undergo the procedure. The anesthesia                         plan was to use general anesthesia. Immediately prior                         to administration of medications, the patient was                         re-assessed for adequacy to receive sedatives. The                         heart rate, respiratory rate, oxygen saturations,                         blood pressure, adequacy of pulmonary ventilation, and                         response to care were monitored throughout the                         procedure. The physical status of the patient was                         re-assessed after the procedure.                        After obtaining informed consent, the colonoscope was                         passed under direct vision. Throughout the procedure,                         the patient's blood pressure, pulse, and oxygen                         saturations were monitored continuously. The  Colonoscope was introduced through the anus and                         advanced to the the cecum, identified by appendiceal                         orifice and ileocecal valve. The colonoscopy was                         performed without difficulty. The patient tolerated                         the procedure well. The quality of the bowel                         preparation was evaluated using the BBPS [facility] Bowel                         Preparation Scale) with scores of: Right Colon = 3,                         Transverse Colon = 3 and Left Colon = 3 (entire mucosa                         seen  well with no residual staining, small fragments                         of stool or opaque liquid). The total BBPS score                         equals 9. The ileocecal valve, appendiceal orifice,                         and rectum were photographed. Findings:      The perianal and digital rectal examinations were normal. Pertinent       negatives include normal sphincter tone and no palpable rectal lesions.      Three sessile polyps were found in the sigmoid colon, descending colon       and cecum. The polyps were 3 to 6 mm in size. These polyps were removed       with a cold snare. Resection and retrieval were complete. Estimated       blood loss: none.      Multiple large-mouthed diverticula were found in the recto-sigmoid colon       and sigmoid colon.      The retroflexed view of the distal rectum and anal verge was normal and       showed no anal or rectal abnormalities. Impression:            - Three 3 to 6 mm polyps in the sigmoid colon, in the                         descending colon and in the cecum, removed with a cold                         snare. Resected and retrieved.                        -  Diverticulosis in the recto-sigmoid colon and in the                         sigmoid colon.                        - The distal rectum and anal verge are normal on                         retroflexion view. Recommendation:        - Discharge patient to home (with escort).                        - Resume previous diet today.                        - Continue present medications.                        - Await pathology results.                        - Repeat colonoscopy in 3 - 5 years for surveillance                         based on pathology results. Procedure Code(s):     --- Professional ---                        [phone], Colonoscopy, flexible; with removal of                         tumor(s), polyp(s), or other lesion(s) by snare                         technique Diagnosis  Code(s):     --- Professional ---                        Z12.11, Encounter for screening for malignant neoplasm                         of colon                        D12.5, Benign neoplasm of sigmoid colon                        D12.4, Benign neoplasm of descending colon                        D12.0, Benign neoplasm of cecum                        K57.30, Diverticulosis of large intestine without                         perforation or abscess without bleeding CPT copyright 2022 American Medical Association. All rights reserved. The codes documented in this report are preliminary and upon coder review may  be revised to meet current compliance requirements. Dr. Libby Maw Toney Reil MD, MD 11/09/2022 9:36:23 AM This report has been  signed electronically. Number of Addenda: 0 Note Initiated On: 11/09/2022 9:13 AM Scope Withdrawal Time: 0 hours 10 minutes 4 seconds  Total Procedure Duration: 0 hours 13 minutes 32 seconds  Estimated Blood Loss:  Estimated blood loss: none.      [facility]

## 2022-11-10 ENCOUNTER — Encounter: Payer: Self-pay | Admitting: Gastroenterology

## 2022-11-10 LAB — SURGICAL PATHOLOGY

## 2022-11-11 ENCOUNTER — Encounter: Payer: Self-pay | Admitting: Gastroenterology

## 2022-11-30 ENCOUNTER — Ambulatory Visit: Payer: BC Managed Care – PPO | Admitting: Family Medicine

## 2022-12-09 ENCOUNTER — Ambulatory Visit (INDEPENDENT_AMBULATORY_CARE_PROVIDER_SITE_OTHER): Payer: BC Managed Care – PPO | Admitting: Dermatology

## 2022-12-09 DIAGNOSIS — L409 Psoriasis, unspecified: Secondary | ICD-10-CM

## 2022-12-09 DIAGNOSIS — D485 Neoplasm of uncertain behavior of skin: Secondary | ICD-10-CM

## 2022-12-09 DIAGNOSIS — D492 Neoplasm of unspecified behavior of bone, soft tissue, and skin: Secondary | ICD-10-CM

## 2022-12-09 DIAGNOSIS — Z7189 Other specified counseling: Secondary | ICD-10-CM

## 2022-12-09 DIAGNOSIS — L918 Other hypertrophic disorders of the skin: Secondary | ICD-10-CM | POA: Diagnosis not present

## 2022-12-09 DIAGNOSIS — L4050 Arthropathic psoriasis, unspecified: Secondary | ICD-10-CM

## 2022-12-09 MED ORDER — VTAMA 1 % EX CREA: TOPICAL_CREAM | CUTANEOUS | 5 refills | Status: DC

## 2022-12-09 NOTE — Patient Instructions (Addendum)
Wound Care Instructions  Cleanse wound gently with soap and water once a day then pat dry with clean gauze. Apply a thin coat of Petrolatum (petroleum jelly, "Vaseline") over the wound (unless you have an allergy to this). We recommend that you use a new, sterile tube of Vaseline. Do not pick or remove scabs. Do not remove the yellow or white "healing tissue" from the base of the wound.  Cover the wound with fresh, clean, nonstick gauze and secure with paper tape. You may use Band-Aids in place of gauze and tape if the wound is small enough, but would recommend trimming much of the tape off as there is often too much. Sometimes Band-Aids can irritate the skin.  You should call the office for your biopsy report after 1 week if you have not already been contacted.  If you experience any problems, such as abnormal amounts of bleeding, swelling, significant bruising, significant pain, or evidence of infection, please call the office immediately.  FOR ADULT SURGERY PATIENTS: If you need something for pain relief you may take 1 extra strength Tylenol (acetaminophen) AND 2 Ibuprofen (200mg  each) together every 4 hours as needed for pain. (do not take these if you are allergic to them or if you have a reason you should not take them.) Typically, you may only need pain medication for 1 to 3 days.   Due to recent changes in healthcare laws, you may see results of your pathology and/or laboratory studies on MyChart before the doctors have had a chance to review them. We understand that in some cases there may be results that are confusing or concerning to you. Please understand that not all results are received at the same time and often the doctors may need to interpret multiple results in order to provide you with the best plan of care or course of treatment. Therefore, we ask that you please give [place] 2 business days to thoroughly review all your results before contacting the office for clarification. Should we  see a critical lab result, you will be contacted sooner.   If You Need Anything After Your Visit  If you have any questions or concerns for your doctor, please call our main line at [phone] and press option 4 to reach your doctor's medical assistant. If no one answers, please leave a voicemail as directed and we will return your call as soon as possible. Messages left after 4 pm will be answered the following business day.   You may also send [place] a message via MyChart. We typically respond to MyChart messages within 1-2 business days.  For prescription refills, please ask your pharmacy to contact our office. Our fax number is [phone].  If you have an urgent issue when the clinic is closed that cannot wait until the next business day, you can page your doctor at the number below.    Please note that while we do our best to be available for urgent issues outside of office hours, we are not available 24/7.   If you have an urgent issue and are unable to reach [place], you may choose to seek medical care at your doctor's office, retail clinic, urgent care center, or emergency room.  If you have a medical emergency, please immediately call 911 or go to the emergency department.  Pager Numbers  - Dr. Gwen Pounds: [phone]  - Dr. Roseanne Reno: [phone]  - Dr. Katrinka Blazing: [phone]   In the event of inclement weather, please call our main line at [phone]  for an update on the status of any delays or closures.  Dermatology Medication Tips: Please keep the boxes that topical medications come in in order to help keep track of the instructions about where and how to use these. Pharmacies typically print the medication instructions only on the boxes and not directly on the medication tubes.   If your medication is too expensive, please contact our office at [phone] option 4 or send [place] a message through MyChart.   We are unable to tell what your co-pay for medications will be in  advance as this is different depending on your insurance coverage. However, we may be able to find a substitute medication at lower cost or fill out paperwork to get insurance to cover a needed medication.   If a prior authorization is required to get your medication covered by your insurance company, please allow [place] 1-2 business days to complete this process.  Drug prices often vary depending on where the prescription is filled and some pharmacies may offer cheaper prices.  The website www.goodrx.com contains coupons for medications through different pharmacies. The prices here do not account for what the cost may be with help from insurance (it may be cheaper with your insurance), but the website can give you the price if you did not use any insurance.  - You can print the associated coupon and take it with your prescription to the pharmacy.  - You may also stop by our office during regular business hours and pick up a GoodRx coupon card.  - If you need your prescription sent electronically to a different pharmacy, notify our office through [facility] or by phone at [phone] option 4.

## 2022-12-09 NOTE — Progress Notes (Signed)
New Patient Visit   Subjective  Sharon Kerr is a 47 y.o. female who presents for the following: Psoriasis for 12 years, diagnosed by Dr. Adolphus Birchwood as well as PCP.   At hands and elbows, uses clobetasol as needed, improved since losing weight. Patient does have joint pain typically all the time in knees, hips and hands, worse when it's cold. Patient does have arthritis.   Patient also with some areas at inframammary and back that get irritated and she would like removed.  The following portions of the chart were reviewed this encounter and updated as appropriate: medications, allergies, medical history  Review of Systems:  No other skin or systemic complaints except as noted in HPI or Assessment and Plan.  Objective  Well appearing patient in no apparent distress; mood and affect are within normal limits.  Areas Examined: Hands, elbows  Relevant exam findings are noted in the Assessment and Plan.    right mid back lateral Brown papule       Assessment & Plan   Neoplasm of uncertain behavior of skin right mid back lateral  Skin / nail biopsy Type of biopsy: tangential   Informed consent: discussed and consent obtained   Timeout: patient name, date of birth, surgical site, and procedure verified   Procedure prep:  Patient was prepped and draped in usual sterile fashion Prep type:  Isopropyl alcohol Anesthesia: the lesion was anesthetized in a standard fashion   Anesthetic:  1% lidocaine w/ epinephrine 1-100,000 buffered w/ 8.4% NaHCO3 Instrument used: flexible razor blade   Hemostasis achieved with: pressure, aluminum chloride and electrodesiccation   Outcome: patient tolerated procedure well   Post-procedure details: sterile dressing applied and wound care instructions given   Dressing type: bandage and petrolatum    Specimen 1 - Surgical pathology Differential Diagnosis: Irritated Nevus r/o Dysplasia vs Skin Tag  Check Margins: No Brown  papule    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  5% BSA.  Chronic condition with duration or expected duration over one year. Currently well-controlled.  Patient does c/o joint pain, typically worse in the morning.   Treatment Plan: Start Vtama once daily to affected areas of psoriasis.  Samples given x 2. Lot # [id number]  Exp: March 2025 If not improving will switch to [facility].  Sample given. Lot # Lucky Rathke  Exp: 06/2023  Continue clobetasol 0.05% 1-2 times daily for up to 2 weeks as needed for flares. Avoid applying to face, groin, and axilla. Use as directed. Long-term use can cause thinning of the skin.  Will refer to rheumatology to be evaluated for psoriatic arthritis. Patient prefers to see Dr. Dierdre Forth in [place].    Counseling on psoriasis and coordination of care  psoriasis is a chronic non-curable, but treatable genetic/hereditary disease that may have other systemic features affecting other organ systems such as joints (Psoriatic Arthritis). It is associated with an increased risk of inflammatory bowel disease, heart disease, non-alcoholic fatty liver disease, and depression.  Treatments include light and laser treatments; topical medications; and systemic medications including oral and injectables.        ACROCHORDONS (Skin Tags) - Removal desired by patient - Fleshy, skin-colored pedunculated papules - Benign appearing.  - Patient desires removal. Reviewed that this is not covered by insurance and they will be charged a cosmetic fee for removal. Patient signed non-covered consent.  Procedure Note: - Prior to the procedure, reviewed the expected  small wound. Also reviewed the risk of leaving a small scar and the small risk of infection.  PROCEDURE - The areas were prepped with isopropyl alcohol. A small amount of lidocaine 1% with epinephrine was  injected at the base of each lesion to achieve good local anesthesia. The skin tags were removed using a snip technique. Aluminum chloride was used for hemostasis. Petrolatum and a bandage were applied. The procedure was tolerated well. - Wound care was reviewed with the patient. They were advised to call with any concerns.  - Locations: neck, inframammary - Total number of treated acrochordons 7    Return in about 6 months (around 06/08/2023) for with Dr. Kirtland Bouchard, Psoriasis.  Anise Salvo, RMA, am acting as scribe for Armida Sans, MD .   Documentation: I have reviewed the above documentation for accuracy and completeness, and I agree with the above.  Armida Sans, MD

## 2022-12-14 ENCOUNTER — Telehealth: Payer: Self-pay

## 2022-12-14 LAB — SURGICAL PATHOLOGY

## 2022-12-14 NOTE — Telephone Encounter (Addendum)
Tried calling patient regarding results. No answer. LM for patient to return call. ----- Message from Armida Sans sent at 12/14/2022  5:33 PM EST ----- FINAL DIAGNOSIS        1. Skin, right mid back lateral :       ACROCHORDON   Benign "skin tag"  No further treatment needed

## 2022-12-15 ENCOUNTER — Encounter: Payer: Self-pay | Admitting: Dermatology

## 2022-12-16 NOTE — Telephone Encounter (Signed)
Patient advised of BX results .aw 

## 2023-01-12 ENCOUNTER — Telehealth: Payer: Self-pay | Admitting: Family Medicine

## 2023-01-12 ENCOUNTER — Ambulatory Visit: Payer: BC Managed Care – PPO | Admitting: Family Medicine

## 2023-01-12 ENCOUNTER — Ambulatory Visit: Payer: BC Managed Care – PPO | Attending: Family Medicine

## 2023-01-12 ENCOUNTER — Encounter: Payer: Self-pay | Admitting: Family Medicine

## 2023-01-12 VITALS — BP 142/82 | HR 90 | Temp 98.9°F | Ht 68.0 in

## 2023-01-12 DIAGNOSIS — R002 Palpitations: Secondary | ICD-10-CM

## 2023-01-12 DIAGNOSIS — R0602 Shortness of breath: Secondary | ICD-10-CM

## 2023-01-12 MED ORDER — PROPRANOLOL HCL 10 MG PO TABS: 10.0000 mg | ORAL_TABLET | Freq: Two times a day (BID) | ORAL | 1 refills | Status: DC | PRN

## 2023-01-12 NOTE — Patient Instructions (Addendum)
Use propranolol if needed.  Let us know if you can't get set up with the Zio patch or if you want to see cardiology.  Go to the lab on the way out.   If you have mychart we'll likely use that to update you.    Take care.  Glad to see you.

## 2023-01-12 NOTE — Telephone Encounter (Addendum)
RN from access nurse said pt having heart palpitations; no CP and no SOB. Access disposition is be seen in 4 hrs. [place] [address] [place] [address] [place] at appt 01/12/23 at 3pm. UC & EDprecautions given and pt voiced understanding. Pt is on her way. Sending note to Dr Para March and Para March pool.

## 2023-01-12 NOTE — Telephone Encounter (Signed)
FYI: This call has been transferred to Access Nurse. Once the result note has been entered staff can address the message at that time.  Patient called in with the following symptoms:  Red Word: heart palpitations   Please advise at Mobile [phone] (mobile)  Message is routed to Provider Pool and [facility] Triage   Pt called in stating she's experiencing palpitations. Pt states she believes it may be from anxiety. Pt states she is also experiencing headaches & heart racing, off & on, especially at night.

## 2023-01-12 NOTE — Progress Notes (Unsigned)
She had cardiology eval in her 27s.  Episodic sx for years, with occ inc heart rate.    Recently, she felt a little SOB but not congested.  More work stressors.  Over the last week, she noted a few extra quick beats over a few seconds. That happened multiple times.    No syncope.  Started menses yesterday with typical HA with menses.  No CP on exertion.  Went to crossfit this week and did fine with that.  Occ baseline mild BLE edema.   Meds, vitals, and allergies reviewed.   ROS: Per HPI unless specifically indicated in ROS section   Normal exam.

## 2023-01-13 ENCOUNTER — Ambulatory Visit: Payer: BC Managed Care – PPO | Admitting: Family

## 2023-01-13 DIAGNOSIS — R002 Palpitations: Secondary | ICD-10-CM | POA: Insufficient documentation

## 2023-01-13 LAB — CBC WITH DIFFERENTIAL/PLATELET
Basophils Absolute: 0.1 10*3/uL (ref 0.0–0.1)
Basophils Relative: 1.5 % (ref 0.0–3.0)
Eosinophils Absolute: 0.5 10*3/uL (ref 0.0–0.7)
Eosinophils Relative: 4.9 % (ref 0.0–5.0)
HCT: 45.8 % (ref 36.0–46.0)
Hemoglobin: 15.2 g/dL — ABNORMAL HIGH (ref 12.0–15.0)
Lymphocytes Relative: 32.1 % (ref 12.0–46.0)
Lymphs Abs: 3.3 10*3/uL (ref 0.7–4.0)
MCHC: 33.3 g/dL (ref 30.0–36.0)
MCV: 90.1 fL (ref 78.0–100.0)
Monocytes Absolute: 0.8 10*3/uL (ref 0.1–1.0)
Monocytes Relative: 7.5 % (ref 3.0–12.0)
Neutro Abs: 5.5 10*3/uL (ref 1.4–7.7)
Neutrophils Relative %: 54 % (ref 43.0–77.0)
Platelets: 258 10*3/uL (ref 150.0–400.0)
RBC: 5.08 Mil/uL (ref 3.87–5.11)
RDW: 13.5 % (ref 11.5–15.5)
WBC: 10.2 10*3/uL (ref 4.0–10.5)

## 2023-01-13 LAB — BASIC METABOLIC PANEL
BUN: 22 mg/dL (ref 6–23)
CO2: 29 meq/L (ref 19–32)
Calcium: 9.7 mg/dL (ref 8.4–10.5)
Chloride: 103 meq/L (ref 96–112)
Creatinine, Ser: 0.81 mg/dL (ref 0.40–1.20)
GFR: 86.28 mL/min (ref >60.00)
Glucose, Bld: 82 mg/dL (ref 70–99)
Potassium: 4.5 meq/L (ref 3.5–5.1)
Sodium: 142 meq/L (ref 135–145)

## 2023-01-13 LAB — BRAIN NATRIURETIC PEPTIDE: Pro B Natriuretic peptide (BNP): 23 pg/mL (ref 0.0–100.0)

## 2023-01-13 LAB — TSH: TSH: 1.29 u[IU]/mL (ref 0.35–5.50)

## 2023-01-13 NOTE — Assessment & Plan Note (Signed)
With occasional shortness of breath noted but her lungs are clear, she is not in distress, and she was able to exercise at baseline this week.  Suspect that premature beats are causing her symptoms.  Discussed that anxiety can make this more likely to happen.  See notes on labs.  Use propranolol as needed.  She is tolerated that in the past.  Will set up Zio patch to establish frequency and total amount of events and we can go from there.  She agrees to plan.  Okay for outpatient follow-up.  Routed to PCP as FYI.

## 2023-01-13 NOTE — Telephone Encounter (Signed)
See OV note.  

## 2023-01-22 DIAGNOSIS — R002 Palpitations: Secondary | ICD-10-CM

## 2023-03-01 ENCOUNTER — Telehealth: Payer: Self-pay

## 2023-03-01 DIAGNOSIS — M2550 Pain in unspecified joint: Secondary | ICD-10-CM

## 2023-03-01 NOTE — Telephone Encounter (Signed)
Patient left voicemail that she would like referral to rheumatology. Per previous note referral sent to [facility] with Dr. Dierdre Forth.

## 2023-03-02 ENCOUNTER — Ambulatory Visit: Payer: BC Managed Care – PPO | Admitting: Pulmonary Disease

## 2023-03-12 ENCOUNTER — Other Ambulatory Visit: Payer: Self-pay | Admitting: Family

## 2023-03-12 DIAGNOSIS — L409 Psoriasis, unspecified: Secondary | ICD-10-CM

## 2023-03-14 ENCOUNTER — Other Ambulatory Visit: Payer: Self-pay | Admitting: Family Medicine

## 2023-03-30 ENCOUNTER — Telehealth: Payer: Self-pay

## 2023-03-30 MED ORDER — CLOBETASOL PROPIONATE 0.05 % EX CREA: TOPICAL_CREAM | CUTANEOUS | 0 refills | Status: DC

## 2023-03-30 NOTE — Telephone Encounter (Signed)
 I returned patient's call. Clobetasol 0.05% cream refill sent to pharmacy. Patient states that Vtama cream did not work, and she lost the Zoryve cream sample. I advised patient that I left Zorvye cream samples at the front desk for her to pick up.

## 2023-05-07 ENCOUNTER — Telehealth (INDEPENDENT_AMBULATORY_CARE_PROVIDER_SITE_OTHER): Admitting: Family Medicine

## 2023-05-07 ENCOUNTER — Encounter: Payer: Self-pay | Admitting: Family Medicine

## 2023-05-07 DIAGNOSIS — U071 COVID-19: Secondary | ICD-10-CM | POA: Diagnosis not present

## 2023-05-07 MED ORDER — ONDANSETRON HCL 4 MG PO TABS: 4.0000 mg | ORAL_TABLET | Freq: Three times a day (TID) | ORAL | 0 refills | Status: DC | PRN

## 2023-05-07 MED ORDER — ALBUTEROL SULFATE HFA 108 (90 BASE) MCG/ACT IN AERS: 2.0000 | INHALATION_SPRAY | RESPIRATORY_TRACT | 0 refills | Status: DC | PRN

## 2023-05-07 MED ORDER — NIRMATRELVIR/RITONAVIR (PAXLOVID)TABLET: 3.0000 | ORAL_TABLET | Freq: Two times a day (BID) | ORAL | 0 refills | Status: AC

## 2023-05-07 MED ORDER — PREDNISONE 10 MG PO TABS: ORAL_TABLET | ORAL | 0 refills | Status: DC

## 2023-05-07 NOTE — Assessment & Plan Note (Addendum)
 Day 3 of symptoms with intermittent fever/cough/congestion  History of asthma-so far not shortness of breath  Paxlovid sent in light of risk factors of obesity and asthma  Disc symptomatic care - see instructions on AVS  Zofran for nausea Refilled albuterol mdi  Prednisone 30 mg taper to start if needed for wheezing or congestion   Update if not starting to improve in a week or if worsening  Call back and Er precautions noted in detail today   Discussed recommendations for isolation  Discusse recommendations for return to work/public and masking

## 2023-05-07 NOTE — Patient Instructions (Signed)
 Drink fluids and rest  mucinex DM is good for cough and congestion  Nasal saline for congestion as needed  Tylenol  and /or ibuprofen for fever or pain or headache  Take prednisone if needed for tight chest or wheezing Use inhaler as needed  Zofran for nausea as needed , caution of sedation   If you can get paxlovid- start it  Please alert [place] if symptoms worsen (if severe or short of breath please go to the ER)

## 2023-05-07 NOTE — Progress Notes (Signed)
 Virtual Visit via Video Note  I connected with Akacia Boltz on 05/07/23 at 11:30 AM EDT by a video enabled telemedicine application and verified that I am speaking with the correct person using two identifiers.  Patient Location: Home Provider Location: Office/Clinic  I discussed the limitations, risks, security, and privacy concerns of performing an evaluation and management service by video and the availability of in person appointments. I also discussed with the patient that there may be a patient responsible charge related to this service. The patient expressed understanding and agreed to proceed.  Parties involved in encounter  Patient: Sharon Kerr   Provider:  Roxy Manns MD   Subjective: PCP: [name], MD  Chief Complaint  Patient presents with   Covid Positive    Tested positive yesterday, sxs started Tuesday night   HPI Symptoms started Tuesday  Scratchy throat  Then chills and body aches (on airplane) Was on way back from [facility]  Took ibuprofen and tylenol   102 highest temp  Had sweats last night  No fever right this minute  Headache/whole head   Nasal congestion -green mucous  Cough -green phlegm  No wheezing - not yet /expects she will  No shortness of breath   Needs more fluids  Some nausea  Some diarrhea    Over the counter  Ibuprofen  Tylenol Nyquil Lozenges  Vics vapor rub  Hot showers/ steam   Sinex nasal spray -? If it works    Lab Results  Component Value Date   NA 142 01/12/2023   K 4.5 01/12/2023   CO2 29 01/12/2023   GLUCOSE 82 01/12/2023   BUN 22 01/12/2023   CREATININE 0.81 01/12/2023   CALCIUM 9.7 01/12/2023   GFR 86.28 01/12/2023   GFRNONAA >60 02/06/2021      ROS: Per HPI Review of Systems  Constitutional:  Positive for chills, fever and malaise/fatigue.  HENT:  Positive for congestion. Negative for ear pain, sinus pain and sore throat.   Eyes:  Negative for blurred vision, discharge and redness.   Respiratory:  Positive for cough and sputum production. Negative for shortness of breath, wheezing and stridor.   Cardiovascular:  Negative for chest pain, palpitations and leg swelling.  Gastrointestinal:  Positive for nausea. Negative for abdominal pain and vomiting.  Musculoskeletal:  Negative for myalgias.  Skin:  Negative for rash.  Neurological:  Positive for headaches. Negative for dizziness.    Current Outpatient Medications:    albuterol (VENTOLIN HFA) 108 (90 Base) MCG/ACT inhaler, Inhale 2 puffs into the lungs every 4 (four) hours as needed for wheezing., Disp: 1 each, Rfl: 0   ARMOUR THYROID 60 MG tablet, Take 60 mg by mouth daily., Disp: , Rfl:    clobetasol cream (TEMOVATE) 0.05 %, Apply to aa's psoriasis QD-BID PRN flares. Avoid applying to face, groin, and axilla. Use as directed. Long-term use can cause thinning of the skin., Disp: 30 g, Rfl: 0   EPINEPHrine (EPIPEN IJ), Inject as directed daily as needed., Disp: , Rfl:    Ibuprofen 200 MG CAPS, Take 600 mg by mouth every 8 (eight) hours as needed., Disp: , Rfl:    nirmatrelvir/ritonavir (PAXLOVID) 20 x 150 MG & 10 x 100MG  TABS, Take 3 tablets by mouth 2 (two) times daily for 5 days. (Take nirmatrelvir 150 mg two tablets twice daily for 5 days and ritonavir 100 mg one tablet twice daily for 5 days) Patient GFR is 86.2, Disp: 30 tablet, Rfl: 0   ondansetron (  ZOFRAN) 4 MG tablet, Take 1 tablet (4 mg total) by mouth every 8 (eight) hours as needed for nausea or vomiting., Disp: 20 tablet, Rfl: 0   predniSONE (DELTASONE) 10 MG tablet, Take 3 pills once daily by mouth for 3 days, then 2 pills once daily for 3 days, then 1 pill once daily for 3 days and then stop, Disp: 18 tablet, Rfl: 0   propranolol (INDERAL) 10 MG tablet, TAKE 1 TABLET BY MOUTH 2 TIMES DAILY AS NEEDED., Disp: 60 tablet, Rfl: 1  Observations/Objective: There were no vitals filed for this visit. Physical Exam Patient appears well, in no distress/ seems fatigued   Weight is baseline  No facial swelling or asymmetry Mildly hoarse voice  No obvious tremor or mobility impairment Moving neck and UEs normally Able to hear the call well  No cough or shortness of breath during interview, no audible wheezing Some throat clearing  Talkative and mentally sharp with no cognitive changes No skin changes on face or neck , no rash or pallor Affect is normal   Assessment and Plan: COVID-19 Assessment & Plan: Day 3 of symptoms with intermittent fever/cough/congestion  History of asthma-so far not shortness of breath  Paxlovid sent in light of risk factors of obesity and asthma  Disc symptomatic care - see instructions on AVS  Zofran for nausea Refilled albuterol mdi  Prednisone 30 mg taper to start if needed for wheezing or congestion   Update if not starting to improve in a week or if worsening  Call back and Er precautions noted in detail today   Discussed recommendations for isolation  Discusse recommendations for return to work/public and masking    Other orders -     nirmatrelvir/ritonavir; Take 3 tablets by mouth 2 (two) times daily for 5 days. (Take nirmatrelvir 150 mg two tablets twice daily for 5 days and ritonavir 100 mg one tablet twice daily for 5 days) Patient GFR is 86.2  Dispense: 30 tablet; Refill: 0 -     Albuterol Sulfate HFA; Inhale 2 puffs into the lungs every 4 (four) hours as needed for wheezing.  Dispense: 1 each; Refill: 0 -     Ondansetron HCl; Take 1 tablet (4 mg total) by mouth every 8 (eight) hours as needed for nausea or vomiting.  Dispense: 20 tablet; Refill: 0 -     predniSONE; Take 3 pills once daily by mouth for 3 days, then 2 pills once daily for 3 days, then 1 pill once daily for 3 days and then stop  Dispense: 18 tablet; Refill: 0    Follow Up Instructions: No follow-ups on file.  Drink fluids and rest  mucinex DM is good for cough and congestion  Nasal saline for congestion as needed  Tylenol  and /or ibuprofen  for fever or pain or headache  Take prednisone if needed for tight chest or wheezing Use inhaler as needed  Zofran for nausea as needed , caution of sedation   If you can get paxlovid- start it  Please alert [place] if symptoms worsen (if severe or short of breath please go to the ER)   I discussed the assessment and treatment plan with the patient. The patient was provided an opportunity to ask questions, and all were answered. The patient agreed with the plan and demonstrated an understanding of the instructions.   The patient was advised to call back or seek an in-person evaluation if the symptoms worsen or if the condition fails to improve as  anticipated.  The above assessment and management plan was discussed with the patient. The patient verbalized understanding of and has agreed to the management plan.   Roxy Manns, MD

## 2023-05-10 ENCOUNTER — Ambulatory Visit: Payer: BC Managed Care – PPO | Admitting: Pulmonary Disease

## 2023-05-25 ENCOUNTER — Ambulatory Visit: Payer: BC Managed Care – PPO | Admitting: Pulmonary Disease

## 2023-05-29 ENCOUNTER — Other Ambulatory Visit: Payer: Self-pay | Admitting: Family Medicine

## 2023-06-23 ENCOUNTER — Ambulatory Visit: Payer: BC Managed Care – PPO | Admitting: Dermatology

## 2023-07-05 ENCOUNTER — Encounter: Payer: Self-pay | Admitting: Dermatology

## 2023-07-05 ENCOUNTER — Ambulatory Visit (INDEPENDENT_AMBULATORY_CARE_PROVIDER_SITE_OTHER): Admitting: Dermatology

## 2023-07-05 DIAGNOSIS — L409 Psoriasis, unspecified: Secondary | ICD-10-CM

## 2023-07-05 DIAGNOSIS — Z7189 Other specified counseling: Secondary | ICD-10-CM | POA: Diagnosis not present

## 2023-07-05 DIAGNOSIS — Z79899 Other long term (current) drug therapy: Secondary | ICD-10-CM

## 2023-07-05 MED ORDER — CLOBETASOL PROPIONATE 0.05 % EX CREA: TOPICAL_CREAM | CUTANEOUS | 1 refills | Status: DC

## 2023-07-05 MED ORDER — ZORYVE 0.3 % EX CREA: TOPICAL_CREAM | CUTANEOUS | 2 refills | Status: DC

## 2023-07-05 NOTE — Patient Instructions (Signed)

## 2023-07-05 NOTE — Progress Notes (Signed)
   Follow-Up Visit   Subjective  Sharon Kerr is a 48 y.o. female who presents for the following: 6 months f/u on Psoriasis, treating with Clobetasol  cream with fair response, past treatment Vtama  cream did not help. Patient report psoriasis affect her quality of life. Patient went to see a rheumatologist 3 months ago and was prescribed Celebrex she took for 3 days and stopped due to side effects, patient don't think she has any type of arthritis   The following portions of the chart were reviewed this encounter and updated as appropriate: medications, allergies, medical history  Review of Systems:  No other skin or systemic complaints except as noted in HPI or Assessment and Plan.  Objective  Well appearing patient in no apparent distress; mood and affect are within normal limits.  Areas Examined: Hands,elbows   Relevant exam findings are noted in the Assessment and Plan.          Assessment & Plan    PSORIASIS Fissuring, peeling and crusting on her hands and elbows  6 % BSA. Chronic and persistent condition with duration or expected duration over one year. Condition is symptomatic/ bothersome to patient. Not currently at goal.  Patient denies joint pain Treatment Plan: Patient decline systemic treatment   Start Zoryve cream apply to affected skin bid  Continue Clobetasol  cream bid but only 3 days a week prn, Avoid applying to face, groin, and axilla. Use as directed. Long-term use can cause thinning of the skin.   Counseling on psoriasis and coordination of care  psoriasis is a chronic non-curable, but treatable genetic/hereditary disease that may have other systemic features affecting other organ systems such as joints (Psoriatic Arthritis). It is associated with an increased risk of inflammatory bowel disease, heart disease, non-alcoholic fatty liver disease, and depression.  Treatments include light and laser treatments; topical medications; and systemic medications  including oral and injectables.  May consider Calcipotriene cream in the future   Return in about 3 months (around 10/05/2023) for Psoriasis .  IClara Crisp, CMA, am acting as scribe for Celine Collard, MD .   Documentation: I have reviewed the above documentation for accuracy and completeness, and I agree with the above.  Celine Collard, MD

## 2023-07-06 ENCOUNTER — Other Ambulatory Visit: Payer: Self-pay

## 2023-07-06 MED ORDER — ZORYVE 0.3 % EX CREA: TOPICAL_CREAM | CUTANEOUS | 2 refills | Status: DC

## 2023-07-06 NOTE — Progress Notes (Signed)
 [employer] denied coverage for Zoryve. Spoke with patient and he has agreed to try [employer] for no covered price. RX sent in and patient advised. aw

## 2023-08-25 ENCOUNTER — Ambulatory Visit: Admitting: Pulmonary Disease

## 2023-09-28 NOTE — Progress Notes (Signed)
 "    Sharon Kerr T. Xochilth Standish, MD, CAQ Sports Medicine [facility] at [facility] [address] [place], [address]  Phone: [phone]  FAX: [phone]  Sharon Kerr - 48 y.o. female  MRN [id number]  Date of Birth: 1975/05/17  Date: 09/29/2023  PCP: Randeen Laine LABOR, MD  Referral: Randeen Laine LABOR, MD  Chief Complaint  Patient presents with   Knee Pain    Left-Hurt exercising about 1 1/2 weeks ago   Subjective:   Sharon Kerr is a 48 y.o. very pleasant female patient with Body mass index is 42.3 kg/m. who presents with the following:  Discussed the use of AI scribe software for clinical note transcription with the patient, who gave verbal consent to proceed.  She is a very nice 48 year old patient who I have known for many years.  She presents with some ongoing acute knee pain History of Present Illness Sharon Kerr is a 48 year old female with left knee osteoarthritis who presents with left knee pain and swelling.  She began experiencing left knee pain and swelling almost two weeks ago after exercising. The pain is located above the knee and is described as excruciating when kneeling, preventing her from performing activities such as getting up from bed. The swelling and fluid-like sensation worsen as the day progresses.  She experiences a popping sensation in the knee while walking, which is painful. She has a history of arthritis in the left knee but has not had any surgeries or fractures. She recalls a previous fall on the left knee, which was x-rayed at the time. She has been managing the symptoms with ice, elevation, heat, ibuprofen, and topical NSAIDs like Voltaren  gel, which provide minimal relief.  She is unable to bend her knee fully backward and cannot kneel. She has stopped exercising due to fear of worsening the condition. She regularly participates in Cross[employer], which she enjoys despite frequent injuries. She notes increased anxiety levels  since she stopped exercising due to her knee pain.  Her current medications include ibuprofen and topical NSAIDs. No previous surgeries or fractures on the left knee.    Review of Systems is noted in the HPI, as appropriate  Objective:   BP 133/86   Pulse 92   Temp 99.5 F (37.5 C) (Temporal)   Ht 5' 8 (1.727 m)   LMP 09/10/2023   SpO2 96%   BMI 42.30 kg/m   GEN: No acute distress; alert,appropriate. PULM: Breathing comfortably in no respiratory distress PSYCH: Normally interactive.   Left knee: Full extension.  Flexion to 115 Mild effusion Minimal patellar motion Tenderness at the medial and lateral joint lines Stable to varus and valgus stress and the ACL and PCL are intact Significant tenderness at the pes anserine bursa No tenderness at the tibial tubercle, patellar tendon or quadricep tendon Forced flexion and McMurray's because minimal pain only  Laboratory and Imaging Data:  Assessment and Plan:     ICD-10-CM   1. Acute pain of left knee  M25.562 triamcinolone  acetonide (KENALOG -40) injection 40 mg    2. Primary osteoarthritis of left knee  M17.12      Assessment & Plan Left knee osteoarthritis with acute exacerbation, complicated by suspected meniscal injury and pes anserine bursitis Acute exacerbation with swelling, pain, and instability. Suspected meniscal injury due to popping sensation and delayed swelling. Pes anserine bursitis present. Likely meniscal tear due to friable cartilage. Meniscal tissue may scar over time, reducing pain. Pain may persist for months. - Administer  knee injection with steroid. Advise rest for 4-5 days post-injection. - Continue using ice and topical Voltaren  gel for symptom relief. - Consider oral anti-inflammatories for pain management. - Advise against high-impact activities such as running, box jumps, and plyometrics. - Recommend low-impact exercises such as cycling and rowing, as tolerated. - Advise caution with squats,  deadlifts, and lunges; perform only if pain-free and with reduced weight.  Aspiration/Injection Procedure Note Leeah Politano Feb 27, 1975 Date of procedure: 09/29/2023  Procedure: Large Joint Aspiration / Injection of Knee, L Indications: Pain  Procedure Details Patient verbally consented to procedure. Risks, benefits, and alternatives explained. Sterilely prepped with Chloraprep. Ethyl cholride used for anesthesia. 9 cc Lidocaine  1% mixed with 1 mL of Kenalog  40 mg injected using the anteromedial approach without difficulty. No complications with procedure and tolerated well. Patient had decreased pain post-injection. Medication: 1 mL of Kenalog  40 mg   Medication Management during today's office visit: Meds ordered this encounter  Medications   triamcinolone  acetonide (KENALOG -40) injection 40 mg   Medications Discontinued During This Encounter  Medication Reason   predniSONE  (DELTASONE ) 10 MG tablet Completed Course    Orders placed today for conditions managed today: No orders of the defined types were placed in this encounter.   Disposition: No follow-ups on file.  [employer] speech-to-text software was used for transcription in this dictation.  Possible transcriptional errors can occur using Animal nutritionist.   Signed,  Jacques DASEN. Christell Steinmiller, MD   Outpatient Encounter Medications as of 09/29/2023  Medication Sig   albuterol  (VENTOLIN  HFA) 108 (90 Base) MCG/ACT inhaler INHALE 2 PUFFS INTO THE LUNGS EVERY 4 HOURS AS NEEDED FOR WHEEZE   ARMOUR THYROID  60 MG tablet Take 60 mg by mouth daily.   clobetasol  cream (TEMOVATE ) 0.05 % Apply to aa's psoriasis QD-BID 3 days a week PRN flares. Avoid applying to face, groin, and axilla. Use as directed. Long-term use can cause thinning of the skin.   EPINEPHrine (EPIPEN IJ) Inject as directed daily as needed.   Ibuprofen 200 MG CAPS Take 600 mg by mouth every 8 (eight) hours as needed.   ondansetron  (ZOFRAN ) 4 MG tablet Take 1  tablet (4 mg total) by mouth every 8 (eight) hours as needed for nausea or vomiting.   propranolol  (INDERAL ) 10 MG tablet TAKE 1 TABLET BY MOUTH 2 TIMES DAILY AS NEEDED.   Roflumilast  (ZORYVE ) 0.3 % CREA Apply to affected skin bid   [DISCONTINUED] predniSONE  (DELTASONE ) 10 MG tablet Take 3 pills once daily by mouth for 3 days, then 2 pills once daily for 3 days, then 1 pill once daily for 3 days and then stop   [EXPIRED] triamcinolone  acetonide (KENALOG -40) injection 40 mg    No facility-administered encounter medications on file as of 09/29/2023.   "

## 2023-09-29 ENCOUNTER — Encounter: Payer: Self-pay | Admitting: Family Medicine

## 2023-09-29 ENCOUNTER — Ambulatory Visit: Admitting: Family Medicine

## 2023-09-29 VITALS — BP 133/86 | HR 92 | Temp 99.5°F | Ht 68.0 in

## 2023-09-29 DIAGNOSIS — M25562 Pain in left knee: Secondary | ICD-10-CM

## 2023-09-29 DIAGNOSIS — M1712 Unilateral primary osteoarthritis, left knee: Secondary | ICD-10-CM | POA: Diagnosis not present

## 2023-09-29 DIAGNOSIS — L4050 Arthropathic psoriasis, unspecified: Secondary | ICD-10-CM | POA: Insufficient documentation

## 2023-09-29 MED ORDER — TRIAMCINOLONE ACETONIDE 40 MG/ML IJ SUSP
40.0000 mg | Freq: Once | INTRAMUSCULAR | Status: AC
  Administered 2023-09-29: 40 mg via INTRA_ARTICULAR

## 2023-10-11 ENCOUNTER — Encounter: Payer: Self-pay | Admitting: Dermatology

## 2023-10-11 ENCOUNTER — Ambulatory Visit (INDEPENDENT_AMBULATORY_CARE_PROVIDER_SITE_OTHER): Admitting: Dermatology

## 2023-10-11 DIAGNOSIS — Z7189 Other specified counseling: Secondary | ICD-10-CM

## 2023-10-11 DIAGNOSIS — Z79899 Other long term (current) drug therapy: Secondary | ICD-10-CM

## 2023-10-11 DIAGNOSIS — L409 Psoriasis, unspecified: Secondary | ICD-10-CM | POA: Diagnosis not present

## 2023-10-11 MED ORDER — CLOBETASOL PROPIONATE 0.05 % EX CREA: TOPICAL_CREAM | CUTANEOUS | 5 refills | Status: AC

## 2023-10-11 MED ORDER — ZORYVE 0.3 % EX CREA: TOPICAL_CREAM | CUTANEOUS | 11 refills | Status: AC

## 2023-10-11 NOTE — Progress Notes (Signed)
   Follow-Up Visit   Subjective  Sharon Kerr is a 48 y.o. female who presents for the following: Psoriasis of the hands and elbows. Hands have significantly improved with Zoryve  cream daily, and she does have Clobetasol  cream to use PRN stubborn flares. She does sometimes forget to treat the elbows, but is very happy with Zorvye cream. The following portions of the chart were reviewed this encounter and updated as appropriate: medications, allergies, medical history  Review of Systems:  No other skin or systemic complaints except as noted in HPI or Assessment and Plan.  Objective  Well appearing patient in no apparent distress; mood and affect are within normal limits.  Areas Examined: The face, hands, and elbows  Relevant exam findings are noted in the Assessment and Plan.   Assessment & Plan  PSORIASIS   COUNSELING AND COORDINATION OF CARE   MEDICATION MANAGEMENT    PSORIASIS Well-demarcated erythematous papules/plaques with silvery scale, guttate pink scaly papules. 3% BSA. Chronic and persistent condition with duration or expected duration over one year. Condition is improving with treatment but not currently at goal.  Pt c/o joint pain, but due to torn meniscus   Treatment Plan: Continue Zorvye cream daily to bid aa's psoriasis QHS.  Continue Clobetasol  0.05% cream to aa's QD-BID PRN up to 5d/wk. Topical steroids (such as triamcinolone , fluocinolone, fluocinonide, mometasone, clobetasol , halobetasol, betamethasone, hydrocortisone) can cause thinning and lightening of the skin if they are used for too long in the same area. Your physician has selected the right strength medicine for your problem and area affected on the body. Please use your medication only as directed by your physician to prevent side effects.   Counseling on psoriasis and coordination of care  psoriasis is a chronic non-curable, but treatable genetic/hereditary disease that may have other systemic  features affecting other organ systems such as joints (Psoriatic Arthritis). It is associated with an increased risk of inflammatory bowel disease, heart disease, non-alcoholic fatty liver disease, and depression.  Treatments include light and laser treatments; topical medications; and systemic medications including oral and injectables.  Return in about 1 year (around 10/10/2024) for psoriasis follow up.and TBSE  I, Rosina Mayans, CMA, am acting as scribe for Alm Rhyme, MD .   Documentation: I have reviewed the above documentation for accuracy and completeness, and I agree with the above.  Alm Rhyme, MD

## 2023-10-11 NOTE — Patient Instructions (Signed)

## 2024-01-14 ENCOUNTER — Ambulatory Visit: Admitting: Family Medicine

## 2024-01-14 ENCOUNTER — Ambulatory Visit: Payer: Self-pay

## 2024-01-14 ENCOUNTER — Ambulatory Visit: Admitting: Nurse Practitioner

## 2024-01-14 VITALS — BP 162/98 | HR 93 | Temp 99.2°F | Ht 68.0 in

## 2024-01-14 DIAGNOSIS — J45901 Unspecified asthma with (acute) exacerbation: Secondary | ICD-10-CM | POA: Insufficient documentation

## 2024-01-14 DIAGNOSIS — R2242 Localized swelling, mass and lump, left lower limb: Secondary | ICD-10-CM | POA: Insufficient documentation

## 2024-01-14 DIAGNOSIS — J4521 Mild intermittent asthma with (acute) exacerbation: Secondary | ICD-10-CM | POA: Diagnosis not present

## 2024-01-14 DIAGNOSIS — J0140 Acute pansinusitis, unspecified: Secondary | ICD-10-CM | POA: Diagnosis not present

## 2024-01-14 MED ORDER — AMOXICILLIN 500 MG PO CAPS: 1000.0000 mg | ORAL_CAPSULE | Freq: Two times a day (BID) | ORAL | 0 refills | Status: AC

## 2024-01-14 MED ORDER — PREDNISONE 20 MG PO TABS: ORAL_TABLET | ORAL | 0 refills | Status: AC

## 2024-01-14 NOTE — Telephone Encounter (Signed)
 FYI Only or Action Required?: FYI only for provider: appointment scheduled on 01/14/24.  Patient was last seen in primary care on 09/29/2023 by Watt Mirza, MD.  Called Nurse Triage reporting Nasal Congestion, Cough, and Wheezing.  Symptoms began several days ago.  Interventions attempted: Nothing.  Symptoms are: unchanged.  Triage Disposition: See Physician Within 24 Hours  Patient/caregiver understands and will follow disposition?: Yes   Copied from CRM #[id number]. Topic: Clinical - Red Word Triage >> Jan 14, 2024  9:38 AM Rea ORN wrote: Red Word that prompted transfer to Nurse Triage: congestion, whezzing, productive cough at times with green phlegm, pt has asthma  Pt is requesting a virtual appt Reason for Disposition  [1] Sinus pain (not just congestion) AND [2] fever  Answer Assessment - Initial Assessment Questions Scheduled 01/14/24  Advised call back if symptoms worsen.  1. LOCATION: Where does it hurt?      Congestion; head, lungs 2. ONSET: When did the sinus pain start?  (e.g., hours, days)      2 weeks ago 3. SEVERITY: How bad is the pain?   (Scale 0-10; or none, mild, moderate or severe)     no 5. NASAL CONGESTION: Is the nose blocked? If Yes, ask: Can you open it or must you breathe through your mouth?     no 6. NASAL DISCHARGE: Do you have discharge from your nose? If so ask, What color?     green 7. FEVER: Do you have a fever? If Yes, ask: What is it, how was it measured, and when did it start?      Denies fver chills, vomiting 8. OTHER SYMPTOMS: Do you have any other symptoms? (e.g., sore throat, cough, earache, difficulty breathing)     Hoarse, HA Denies dizziness, diff breathing, earache, chest pain  Protocols used: Sinus Pain or Congestion-A-AH

## 2024-01-14 NOTE — Assessment & Plan Note (Signed)
 Acute, possible foot sprain versus resolving gout flare. Symptoms will likely improve with elevation, ice and likely improve with prednisone  taper being given for asthma. No focal tenderness to palpation suggestive of fracture.  Return and ER precautions provided.

## 2024-01-14 NOTE — Telephone Encounter (Signed)
 Next Appt With Family Medicine Rubie Crandall, NP) 01/14/2024 at 2:00 PM

## 2024-01-14 NOTE — Telephone Encounter (Signed)
 I was able to speak with pt and change appt to seeing Dr Avelina 01/14/24 at 3pm; Arland CMA approved and aware of appt change. UC& ED precautions given and pt voiced understanding. Sending to Dr Avelina lindwood Avelina pool.

## 2024-01-14 NOTE — Assessment & Plan Note (Signed)
 Acute, likely initial viral infection now with bacterial superinfection. Treatment amoxicillin  500 mg 2 tablets twice daily x 10 days. Can use nasal saline irrigation. Will treat with prednisone  taper for sinus pressure as well as asthma exacerbation.

## 2024-01-14 NOTE — Telephone Encounter (Signed)
 Noted. Will evaluate in office

## 2024-01-14 NOTE — Assessment & Plan Note (Signed)
 Acute, use albuterol  2 puffs every 4-6 hours as needed for wheeze. Complete prednisone  taper.  If severe shortness of breath go to emergency room. Call if not improving as expected over the next 48 to 72 hours.

## 2024-01-14 NOTE — Progress Notes (Signed)
 Patient ID: Sharon Kerr, female    DOB: 02-27-1975, 48 y.o.   MRN: [id number]  This visit was conducted in person.  BP (!) 162/98   Pulse 93   Temp 99.2 F (37.3 C) (Oral)   Ht 5' 8 (1.727 m)   LMP 12/10/2023   SpO2 97%   BMI 42.30 kg/m    CC:  Chief Complaint  Patient presents with   Cough    X 2 weeks   Nasal Congestion   Shortness of Breath   Hoarse   Wheezing   Foot Swelling    Left   Headache    Subjective:   HPI: Sharon Kerr is a 48 y.o. female presenting on 01/14/2024 for Cough (X 2 weeks), Nasal Congestion, Shortness of Breath, Hoarse, Wheezing, Foot Swelling (Left), and Headache    Date of onset:  2 weeks Initial symptoms included  ST, fever Symptoms progressed to  head congestion, cough, occ productive  Laryngitis   Left ear pain and pressure  Ear pain in left few days ago.  Low grade temp today   Chest tightness and SOB, occ wheeze    Sick contacts:   children COVID testing:   none     She has tried to treat with  albuterol  ... Using  occ.. helped temporarily.     History of  asthma Non-smoker.   Over T Day she was on her feet and  wearing clog compressing left foot. Cause swelling.  Bulge on top of left foot.  Some redness, slightly itchy.      Relevant past medical, surgical, family and social history reviewed and updated as indicated. Interim medical history since our last visit reviewed. Allergies and medications reviewed and updated. Outpatient Medications Prior to Visit  Medication Sig Dispense Refill   albuterol  (VENTOLIN  HFA) 108 (90 Base) MCG/ACT inhaler INHALE 2 PUFFS INTO THE LUNGS EVERY 4 HOURS AS NEEDED FOR WHEEZE 8.5 each 0   ARMOUR THYROID  60 MG tablet Take 60 mg by mouth daily.     clobetasol  cream (TEMOVATE ) 0.05 % Apply to aa's psoriasis QD-BID 3 days a week PRN flares. Avoid applying to face, groin, and axilla. Use as directed. Long-term use can cause thinning of the skin. 30 g 5   EPINEPHrine (EPIPEN IJ)  Inject as directed daily as needed.     Ibuprofen 200 MG CAPS Take 600 mg by mouth every 8 (eight) hours as needed.     propranolol  (INDERAL ) 10 MG tablet TAKE 1 TABLET BY MOUTH 2 TIMES DAILY AS NEEDED. 60 tablet 1   Roflumilast  (ZORYVE ) 0.3 % CREA Apply to aa's psoriasis QHS. 60 g 11   No facility-administered medications prior to visit.     Per HPI unless specifically indicated in ROS section below Review of Systems  Constitutional:  Negative for fatigue and fever.  HENT:  Positive for congestion, ear pain, sinus pressure, sinus pain and sore throat.   Eyes:  Negative for pain.  Respiratory:  Positive for cough. Negative for shortness of breath.   Cardiovascular:  Negative for chest pain, palpitations and leg swelling.  Gastrointestinal:  Negative for abdominal pain.  Genitourinary:  Negative for dysuria and vaginal bleeding.  Musculoskeletal:  Positive for myalgias. Negative for back pain.  Neurological:  Negative for syncope, light-headedness and headaches.  Psychiatric/Behavioral:  Negative for dysphoric mood.    Objective:  BP (!) 162/98   Pulse 93   Temp 99.2 F (37.3 C) (Oral)   Ht 5' 8 (  1.727 m)   LMP 12/10/2023   SpO2 97%   BMI 42.30 kg/m   Wt Readings from Last 3 Encounters:  11/09/22 278 lb 3.2 oz (126.2 kg)  05/07/22 298 lb (135.2 kg)  04/14/22 (!) 310 lb (140.6 kg)      Physical Exam Constitutional:      General: She is not in acute distress.    Appearance: Normal appearance. She is well-developed. She is not ill-appearing or toxic-appearing.  HENT:     Head: Normocephalic.     Right Ear: Hearing, ear canal and external ear normal. A middle ear effusion is present. Tympanic membrane is not erythematous, retracted or bulging.     Left Ear: Hearing, ear canal and external ear normal. A middle ear effusion is present. Tympanic membrane is not erythematous, retracted or bulging.     Nose: No mucosal edema or rhinorrhea.     Right Sinus: Maxillary sinus  tenderness and frontal sinus tenderness present.     Left Sinus: Maxillary sinus tenderness and frontal sinus tenderness present.     Mouth/Throat:     Pharynx: Uvula midline.  Eyes:     General: Lids are normal. Lids are everted, no foreign bodies appreciated.     Conjunctiva/sclera: Conjunctivae normal.     Pupils: Pupils are equal, round, and reactive to light.  Neck:     Thyroid : No thyroid  mass or thyromegaly.     Vascular: No carotid bruit.     Trachea: Trachea normal.  Cardiovascular:     Rate and Rhythm: Normal rate and regular rhythm.     Pulses: Normal pulses.          Dorsalis pedis pulses are 2+ on the left side.     Heart sounds: Normal heart sounds, S1 normal and S2 normal. No murmur heard.    No friction rub. No gallop.  Pulmonary:     Effort: Pulmonary effort is normal. No tachypnea or respiratory distress.     Breath sounds: Normal breath sounds. No decreased breath sounds, wheezing, rhonchi or rales.  Abdominal:     General: Bowel sounds are normal.     Palpations: Abdomen is soft.     Tenderness: There is no abdominal tenderness.  Musculoskeletal:     Cervical back: Normal range of motion and neck supple.     Left foot: Normal range of motion. Deformity present. No Charcot foot or foot drop.  Feet:     Left foot:     Skin integrity: Erythema and warmth present. No ulcer, blister, skin breakdown or fissure.     Comments: Tenderness to palpation over left dorsal foot.  Area above the shoe swollen nonspecifically no swelling past midfoot Right foot also swollen but no redness and warmth Skin:    General: Skin is warm and dry.     Findings: No rash.  Neurological:     Mental Status: She is alert.  Psychiatric:        Mood and Affect: Mood is not anxious or depressed.        Speech: Speech normal.        Behavior: Behavior normal. Behavior is cooperative.        Thought Content: Thought content normal.        Judgment: Judgment normal.       Results for  orders placed or performed in visit on 01/12/23  Basic metabolic panel   Collection Time: 01/12/23  3:54 PM  Result Value Ref Range   Sodium  142 135 - 145 mEq/L   Potassium 4.5 3.5 - 5.1 mEq/L   Chloride 103 96 - 112 mEq/L   CO2 29 19 - 32 mEq/L   Glucose, Bld 82 70 - 99 mg/dL   BUN 22 6 - 23 mg/dL   Creatinine, Ser 9.18 0.40 - 1.20 mg/dL   GFR 13.71 >39.99 mL/min   Calcium  9.7 8.4 - 10.5 mg/dL  CBC with Differential/Platelet   Collection Time: 01/12/23  3:54 PM  Result Value Ref Range   WBC 10.2 4.0 - 10.5 K/uL   RBC 5.08 3.87 - 5.11 Mil/uL   Hemoglobin 15.2 (H) 12.0 - 15.0 g/dL   HCT 54.1 63.9 - 53.9 %   MCV 90.1 78.0 - 100.0 fl   MCHC 33.3 30.0 - 36.0 g/dL   RDW 86.4 88.4 - 84.4 %   Platelets 258.0 150.0 - 400.0 K/uL   Neutrophils Relative % 54.0 43.0 - 77.0 %   Lymphocytes Relative 32.1 12.0 - 46.0 %   Monocytes Relative 7.5 3.0 - 12.0 %   Eosinophils Relative 4.9 0.0 - 5.0 %   Basophils Relative 1.5 0.0 - 3.0 %   Neutro Abs 5.5 1.4 - 7.7 K/uL   Lymphs Abs 3.3 0.7 - 4.0 K/uL   Monocytes Absolute 0.8 0.1 - 1.0 K/uL   Eosinophils Absolute 0.5 0.0 - 0.7 K/uL   Basophils Absolute 0.1 0.0 - 0.1 K/uL  TSH   Collection Time: 01/12/23  3:54 PM  Result Value Ref Range   TSH 1.29 0.35 - 5.50 uIU/mL  Brain natriuretic peptide   Collection Time: 01/12/23  3:54 PM  Result Value Ref Range   Pro B Natriuretic peptide (BNP) 23.0 0.0 - 100.0 pg/mL    Assessment and Plan  Acute non-recurrent pansinusitis Assessment & Plan: Acute, likely initial viral infection now with bacterial superinfection. Treatment amoxicillin  500 mg 2 tablets twice daily x 10 days. Can use nasal saline irrigation. Will treat with prednisone  taper for sinus pressure as well as asthma exacerbation.   Localized swelling of left foot Assessment & Plan: Acute, possible foot sprain versus resolving gout flare. Symptoms will likely improve with elevation, ice and likely improve with prednisone  taper being  given for asthma. No focal tenderness to palpation suggestive of fracture.  Return and ER precautions provided.   Mild intermittent asthma with exacerbation Assessment & Plan: Acute, use albuterol  2 puffs every 4-6 hours as needed for wheeze. Complete prednisone  taper.  If severe shortness of breath go to emergency room. Call if not improving as expected over the next 48 to 72 hours.   Other orders -     Amoxicillin ; Take 2 capsules (1,000 mg total) by mouth 2 (two) times daily.  Dispense: 40 capsule; Refill: 0 -     predniSONE ; 3 tabs by mouth daily x 3 days, then 2 tabs by mouth daily x 2 days then 1 tab by mouth daily x 2 days  Dispense: 15 tablet; Refill: 0    No follow-ups on file.   Greig Ring, MD

## 2024-01-14 NOTE — Telephone Encounter (Signed)
 Aware, will watch for correspondence Thanks for seeing her

## 2024-10-10 ENCOUNTER — Ambulatory Visit: Admitting: Dermatology
